# Patient Record
Sex: Male | Born: 1975 | Race: White | Hispanic: No | Marital: Married | Smoking: Never smoker
Health system: Southern US, Community
[De-identification: ages and names within clinical notes are randomized; demographics above are authoritative.]

## PROBLEM LIST (undated history)

## (undated) DIAGNOSIS — J45909 Unspecified asthma, uncomplicated: Secondary | ICD-10-CM

## (undated) DIAGNOSIS — M1990 Unspecified osteoarthritis, unspecified site: Secondary | ICD-10-CM

## (undated) DIAGNOSIS — E669 Obesity, unspecified: Secondary | ICD-10-CM

## (undated) DIAGNOSIS — G4730 Sleep apnea, unspecified: Secondary | ICD-10-CM

## (undated) DIAGNOSIS — G43909 Migraine, unspecified, not intractable, without status migrainosus: Secondary | ICD-10-CM

## (undated) DIAGNOSIS — I4891 Unspecified atrial fibrillation: Secondary | ICD-10-CM

## (undated) DIAGNOSIS — M797 Fibromyalgia: Secondary | ICD-10-CM

## (undated) HISTORY — DX: Unspecified asthma, uncomplicated: J45.909

## (undated) HISTORY — DX: Unspecified osteoarthritis, unspecified site: M19.90

## (undated) HISTORY — DX: Migraine, unspecified, not intractable, without status migrainosus: G43.909

## (undated) HISTORY — DX: Sleep apnea, unspecified: G47.30

## (undated) HISTORY — DX: Obesity, unspecified: E66.9

## (undated) HISTORY — DX: Unspecified atrial fibrillation: I48.91

## (undated) HISTORY — DX: Fibromyalgia: M79.7

---

## 2003-11-08 ENCOUNTER — Encounter (HOSPITAL_COMMUNITY): Admission: RE | Admit: 2003-11-08 | Discharge: 2003-12-08 | Payer: Self-pay | Admitting: Orthopedic Surgery

## 2003-12-13 ENCOUNTER — Encounter (HOSPITAL_COMMUNITY): Admission: RE | Admit: 2003-12-13 | Discharge: 2004-01-12 | Payer: Self-pay | Admitting: Orthopedic Surgery

## 2003-12-21 ENCOUNTER — Encounter (HOSPITAL_COMMUNITY): Admission: RE | Admit: 2003-12-21 | Discharge: 2004-01-19 | Payer: Self-pay | Admitting: Orthopedic Surgery

## 2004-05-12 ENCOUNTER — Ambulatory Visit (HOSPITAL_COMMUNITY): Admission: RE | Admit: 2004-05-12 | Discharge: 2004-05-12 | Payer: Self-pay | Admitting: Specialist

## 2004-07-22 HISTORY — PX: SHOULDER SURGERY: SHX246

## 2004-08-13 ENCOUNTER — Encounter (HOSPITAL_COMMUNITY): Admission: RE | Admit: 2004-08-13 | Discharge: 2004-09-12 | Payer: Self-pay | Admitting: Orthopedic Surgery

## 2004-09-24 ENCOUNTER — Encounter (HOSPITAL_COMMUNITY): Admission: RE | Admit: 2004-09-24 | Discharge: 2004-10-24 | Payer: Self-pay | Admitting: Specialist

## 2005-02-13 ENCOUNTER — Encounter: Admission: RE | Admit: 2005-02-13 | Discharge: 2005-02-13 | Payer: Self-pay | Admitting: Specialist

## 2006-02-28 ENCOUNTER — Emergency Department (HOSPITAL_COMMUNITY): Admission: EM | Admit: 2006-02-28 | Discharge: 2006-02-28 | Payer: Self-pay | Admitting: Family Medicine

## 2006-04-09 ENCOUNTER — Ambulatory Visit (HOSPITAL_COMMUNITY): Admission: RE | Admit: 2006-04-09 | Discharge: 2006-04-09 | Payer: Self-pay | Admitting: General Surgery

## 2006-04-21 HISTORY — PX: HEMANGIOMA EXCISION: SHX1734

## 2006-05-16 ENCOUNTER — Encounter: Admission: RE | Admit: 2006-05-16 | Discharge: 2006-05-16 | Payer: Self-pay | Admitting: Vascular Surgery

## 2006-05-28 ENCOUNTER — Ambulatory Visit (HOSPITAL_COMMUNITY): Admission: RE | Admit: 2006-05-28 | Discharge: 2006-05-28 | Payer: Self-pay | Admitting: Vascular Surgery

## 2010-10-18 ENCOUNTER — Encounter: Payer: Self-pay | Admitting: Family Medicine

## 2010-10-18 DIAGNOSIS — G43909 Migraine, unspecified, not intractable, without status migrainosus: Secondary | ICD-10-CM

## 2010-10-18 DIAGNOSIS — J45909 Unspecified asthma, uncomplicated: Secondary | ICD-10-CM | POA: Insufficient documentation

## 2013-04-21 ENCOUNTER — Encounter: Payer: Self-pay | Admitting: Physician Assistant

## 2013-04-21 ENCOUNTER — Ambulatory Visit (INDEPENDENT_AMBULATORY_CARE_PROVIDER_SITE_OTHER): Payer: BC Managed Care – PPO | Admitting: Physician Assistant

## 2013-04-21 VITALS — BP 142/96 | HR 104 | Temp 100.9°F | Resp 20 | Ht 69.5 in | Wt 233.0 lb

## 2013-04-21 DIAGNOSIS — A084 Viral intestinal infection, unspecified: Secondary | ICD-10-CM

## 2013-04-21 DIAGNOSIS — A088 Other specified intestinal infections: Secondary | ICD-10-CM

## 2013-04-21 LAB — CBC W/MCH & 3 PART DIFF
HCT: 44.8 % (ref 39.0–52.0)
Hemoglobin: 15.2 g/dL (ref 13.0–17.0)
Lymphocytes Relative: 9 % — ABNORMAL LOW (ref 12–46)
Lymphs Abs: 0.9 10*3/uL (ref 0.7–4.0)
MCH: 29.2 pg (ref 26.0–34.0)
MCHC: 33.9 g/dL (ref 30.0–36.0)
MCV: 86 fL (ref 78.0–100.0)
Neutro Abs: 8.4 10*3/uL — ABNORMAL HIGH (ref 1.7–7.7)
Neutrophils Relative %: 85 % — ABNORMAL HIGH (ref 43–77)
Platelets: 213 10*3/uL (ref 150–400)
RBC: 5.21 MIL/uL (ref 4.22–5.81)
RDW: 13.6 % (ref 11.5–15.5)
WBC mixed population %: 6 % (ref 3–18)
WBC mixed population: 0.6 10*3/uL (ref 0.1–1.8)
WBC: 9.9 10*3/uL (ref 4.0–10.5)

## 2013-04-21 LAB — COMPLETE METABOLIC PANEL WITH GFR
ALT: 20 U/L (ref 0–53)
AST: 17 U/L (ref 0–37)
Albumin: 4.3 g/dL (ref 3.5–5.2)
Alkaline Phosphatase: 76 U/L (ref 39–117)
BUN: 11 mg/dL (ref 6–23)
CO2: 24 mEq/L (ref 19–32)
Calcium: 9.3 mg/dL (ref 8.4–10.5)
Chloride: 100 mEq/L (ref 96–112)
Creat: 1.15 mg/dL (ref 0.50–1.35)
GFR, Est African American: >89 mL/min
GFR, Est Non African American: 81 mL/min
Glucose, Bld: 102 mg/dL — ABNORMAL HIGH (ref 70–99)
Potassium: 4.2 mEq/L (ref 3.5–5.3)
Sodium: 136 mEq/L (ref 135–145)
Total Bilirubin: 0.5 mg/dL (ref 0.3–1.2)
Total Protein: 6.8 g/dL (ref 6.0–8.3)

## 2013-04-21 NOTE — Progress Notes (Signed)
Patient ID: Stephen Allen MRN: [id number], DOB: 24-Mar-1976, 37 y.o. Date of Encounter: 04/21/2013, 12:56 PM    Chief Complaint:  Chief Complaint  Patient presents with  . c/o fever, diarrhea x 3 days    no vomiting, does have abd pain     HPI: 37 y.o. year old white male reports that he started feeling sick 4 days ago. At that time he just felt tired and weak. The next day he developed diarrhea and fever. Later that day he was having diarrhea every 15-30 minutes. He says that this is continued except for when he is sleeping! Has had no vomiting. No significant nausea. No localized or focal abdominal pain. Has diffuse crampy type pains throughout his abdomen. Says that his wife and kids are not sick but also notes that she works as a TEFL teacher and says she has "sterilized  the house."  He has no GI history.     Home Meds: See attached medication section for any medications that were entered at today's visit. The computer does not put those onto this list.The following list is a list of meds entered prior to today's visit.   Current Outpatient Prescriptions on File Prior to Visit  Medication Sig Dispense Refill  . budesonide-formoterol (SYMBICORT) 160-4.5 MCG/ACT inhaler Inhale 2 puffs into the lungs 2 (two) times daily.        . fish oil-omega-3 fatty acids 1000 MG capsule Take 2 g by mouth 2 (two) times daily.         No current facility-administered medications on file prior to visit.    Allergies:  Allergies  Allergen Reactions  . Ventolin [Kdc:Albuterol]     Migraines      Review of Systems: See HPI for pertinent ROS. All other ROS negative.    Physical Exam: Blood pressure 142/96, pulse 104, temperature 100.9 F (38.3 C), temperature source Oral, resp. rate 20, height 5' 9.5" (1.765 m), weight 233 lb (105.688 kg)., Body mass index is 33.93 kg/(m^2). General: Well-nourished well-developed white male. Appears in no acute distress. Lungs: Clear bilaterally to  auscultation without wheezes, rales, or rhonchi. Breathing is unlabored. Heart: Regular rhythm. No murmurs, rubs, or gallops. Abdomen: Soft, non-tender, non-distended with normoactive bowel sounds. No hepatomegaly. No rebound/guarding. No obvious abdominal masses. I have firmly palpated his abdomen and there are no areas of significant tenderness. No tenderness in the Peri Umbilical or right lower quadrant. No tenderness in the left lower quadrant. Msk:  Strength and tone normal for age. Extremities/Skin: Warm and dry. No clubbing or cyanosis. No edema. No rashes or suspicious lesions. Neuro: Alert and oriented X 3. Moves all extremities spontaneously. Gait is normal. CNII-XII grossly in tact. Psych:  Responds to questions appropriately with a normal affect.   Results for orders placed in visit on 04/21/13  CBC [facility] & 3 PART DIFF      Result Value Range   WBC 9.9  4.0 - 10.5 K/uL   RBC 5.21  4.22 - 5.81 MIL/uL   Hemoglobin 15.2  13.0 - 17.0 g/dL   HCT 40.9  81.1 - 91.4 %   MCV 86.0  78.0 - 100.0 fL   MCH 29.2  26.0 - 34.0 pg   MCHC 33.9  30.0 - 36.0 g/dL   RDW 78.2  95.6 - 21.3 %   Platelets 213  150 - 400 K/uL   Neutrophils Relative % 85 (*) 43 - 77 %   Neutro Abs 8.4 (*) 1.7 - 7.7  K/uL   Lymphocytes Relative 9 (*) 12 - 46 %   Lymphs Abs 0.9  0.7 - 4.0 K/uL   WBC mixed population % 6  3 - 18 %   WBC mixed population 0.6  0.1 - 1.8 K/uL     ASSESSMENT AND PLAN:  37 y.o. year old male with  1. Viral gastroenteritis No tenderness with palpation to suggest appendicitis or diverticulitis. As well white count normal. All consistent with viral gastroenteritis. Discussed using IV fluids but patient does not feel dehydrated. Says he does not feel lightheaded at all. Continue to force fluids. Stay with clear liquid diet until diarrhea resolves and then for 24 hours after. Then can gradually go to a very bland diet. Given duration and frequency of his diarrhea I recommend he go ahead and  use some over-the-counter Imodium to slow this down to prevent dehydration. Followup if worsens or  becomes lightheaded/develops signs and symptoms of dehydration or if diarrhea persists a full week without resolution - COMPLETE METABOLIC PANEL WITH GFR - CBC w/[facility] & 3 Part Diff   Signed, [address] [place], [place], [facility] 04/21/2013 12:56 PM

## 2013-07-20 ENCOUNTER — Telehealth: Payer: Self-pay | Admitting: Family Medicine

## 2013-07-20 NOTE — Telephone Encounter (Signed)
Can you elaborate on this a little more??

## 2013-07-20 NOTE — Telephone Encounter (Signed)
Pt is wanting to know what is going on with his preventive inhaler Pharmacy is [facility] back [phone]

## 2013-07-24 ENCOUNTER — Other Ambulatory Visit: Payer: Self-pay | Admitting: Family Medicine

## 2014-09-26 ENCOUNTER — Ambulatory Visit (INDEPENDENT_AMBULATORY_CARE_PROVIDER_SITE_OTHER): Payer: BC Managed Care – PPO | Admitting: Physician Assistant

## 2014-09-26 ENCOUNTER — Encounter: Payer: Self-pay | Admitting: Physician Assistant

## 2014-09-26 VITALS — BP 126/76 | HR 76 | Temp 97.9°F | Resp 18 | Ht 69.5 in | Wt 246.0 lb

## 2014-09-26 DIAGNOSIS — B351 Tinea unguium: Secondary | ICD-10-CM

## 2014-09-26 DIAGNOSIS — Z0000 Encounter for general adult medical examination without abnormal findings: Secondary | ICD-10-CM | POA: Diagnosis not present

## 2014-09-26 DIAGNOSIS — M25572 Pain in left ankle and joints of left foot: Secondary | ICD-10-CM | POA: Diagnosis not present

## 2014-09-26 DIAGNOSIS — J4520 Mild intermittent asthma, uncomplicated: Secondary | ICD-10-CM | POA: Diagnosis not present

## 2014-09-26 MED ORDER — TERBINAFINE HCL 250 MG PO TABS: 250.0000 mg | ORAL_TABLET | Freq: Every day | ORAL | Status: DC

## 2014-09-26 MED ORDER — HYDROCODONE-ACETAMINOPHEN 5-325 MG PO TABS: 1.0000 | ORAL_TABLET | Freq: Four times a day (QID) | ORAL | Status: DC | PRN

## 2014-09-26 MED ORDER — CYCLOBENZAPRINE HCL 10 MG PO TABS: 10.0000 mg | ORAL_TABLET | Freq: Three times a day (TID) | ORAL | Status: DC | PRN

## 2014-09-26 NOTE — Progress Notes (Signed)
Patient ID: Stephen BussingDaniel K Allen MRN: [id number], DOB: 09-06-1975 39 y.o. Date of Encounter: 09/26/2014, 4:17 PM    Chief Complaint: Physical (CPE)  HPI: 39 y.o. y/o white male here for CPE.   He has a couple of issues he wanted to discuss today.  Says that he has been having pain in his feet left worse than right. Says that has been bothering him for quite some time.  Says he thinks he may have toenail fungus and is wondering about treatment for this.  States that he has had no problem with his asthma in several years. Is not using Symbicort.   Says  he is not currently needing too take any medications at present.  Says he take likes to keep Flexeril and Vicodin on hand in case he has a flare of back pain. Is requesting refills on these today because he thinks his last prescription is expired.   Review of Systems: Consitutional: No fever, chills, fatigue, night sweats, lymphadenopathy, or weight changes. Eyes: No visual changes, eye redness, or discharge. ENT/Mouth: Ears: No otalgia, tinnitus, hearing loss, discharge. Nose: No congestion, rhinorrhea, sinus pain, or epistaxis. Throat: No sore throat, post nasal drip, or teeth pain. Cardiovascular: No CP, palpitations, diaphoresis, DOE, edema, orthopnea, PND. Respiratory: No cough, hemoptysis, SOB, or wheezing. Gastrointestinal: No anorexia, dysphagia, reflux, pain, nausea, vomiting, hematemesis, diarrhea, constipation, BRBPR, or melena. Genitourinary: No dysuria, frequency, urgency, hematuria, incontinence, nocturia, decreased urinary stream, discharge, impotence, or testicular pain/masses. Musculoskeletal: No decreased ROM, myalgias, stiffness, joint swelling, or weakness. Skin: No rash, erythema, lesion changes, pain, warmth, jaundice, or pruritis. Neurological: No headache, dizziness, syncope, seizures, tremors, memory loss, coordination problems, or paresthesias. Psychological: No anxiety, depression, hallucinations,  SI/HI. Endocrine: No fatigue, polydipsia, polyphagia, polyuria, or known diabetes. All other systems were reviewed and are otherwise negative.  Past Medical History  Diagnosis Date  . Asthma     Dx'd age 89  . Migraines      History reviewed. No pertinent past surgical history.  Home Meds:  Outpatient Prescriptions Prior to Visit  Medication Sig Dispense Refill  . ibuprofen (ADVIL,MOTRIN) 200 MG tablet Take 200 mg by mouth every 6 (six) hours as needed for pain.    . budesonide-formoterol (SYMBICORT) 160-4.5 MCG/ACT inhaler Inhale 2 puffs into the lungs 2 (two) times daily.      . fish oil-omega-3 fatty acids 1000 MG capsule Take 2 g by mouth 2 (two) times daily.      Marland Kitchen. QVAR 80 MCG/ACT inhaler INHALE 2 PUFFS TWICE DAILY. (Patient not taking: Reported on 09/26/2014) 8.7 g PRN   No facility-administered medications prior to visit.    Allergies:  Allergies  Allergen Reactions  . Ventolin [Kdc:Albuterol]     Migraines    History   Social History  . Marital Status: Married    Spouse Name: N/A  . Number of Children: N/A  . Years of Education: N/A   Occupational History  . Not on file.   Social History Main Topics  . Smoking status: Never Smoker   . Smokeless tobacco: Never Used  . Alcohol Use: 0.0 oz/week    0 Standard drinks or equivalent per week  . Drug Use: No  . Sexual Activity: Not on file   Other Topics Concern  . Not on file   Social History Narrative    Family History  Problem Relation Age of Onset  . Hypertension Father   . Asthma Brother   . Cancer Paternal Grandmother  Breast    Physical Exam: Blood pressure 126/76, pulse 76, temperature 97.9 F (36.6 C), temperature source Oral, resp. rate 18, height 5' 9.5" (1.765 m), weight 246 lb (111.585 kg).  General: Well developed, well nourished,WM. Appears in no acute distress. HEENT: Normocephalic, atraumatic. Conjunctiva pink, sclera non-icteric. Pupils 2 mm constricting to 1 mm, round, regular,  and equally reactive to light and accomodation. EOMI. Internal auditory canal clear. TMs with good cone of light and without pathology. Nasal mucosa pink. Nares are without discharge. No sinus tenderness. Oral mucosa pink. Dentition good. Pharynx without exudate.   Neck: Supple. Trachea midline. No thyromegaly. Full ROM. No lymphadenopathy. No carotid bruit. Lungs: Clear to auscultation bilaterally without wheezes, rales, or rhonchi. Breathing is of normal effort and unlabored. Cardiovascular: RRR with S1 S2. No murmurs, rubs, or gallops. Distal pulses 2+ symmetrically. No carotid or abdominal bruits. Abdomen: Soft, non-tender, non-distended with normoactive bowel sounds. No hepatosplenomegaly or masses. No rebound/guarding. No CVA tenderness. No hernias. Musculoskeletal: Full range of motion and 5/5 strength throughout.  Left Foot: Tender with palpation at edge of foot, near outer side of foot. No tenderness with palpation along medial edge of foot and along arch. No tenderness with palpation of the heel. Skin: Warm and moist without erythema, ecchymosis, wounds, or rash. Toenails: Right second and fifth toenails are thick and opaque consistent with onychomycosis. Neuro: A+Ox3. CN II-XII grossly intact. Moves all extremities spontaneously. Full sensation throughout. Normal gait. DTR 2+ throughout upper and lower extremities.  Psych:  Responds to questions appropriately with a normal affect.   Assessment/Plan:  39 y.o. y/o  male here for CPE  -1. Visit for preventive health examination  A. Screening Labs: He had full panel of screening labs 06/04/12 and all labs were normal. You did CBC, CMP T, FLP, TSH. I discussed having him return fasting to check lipid panel and discuss whether he thinks his cholesterol would be fairly stable compared to 2013. He thinks his diet would probably actually improved compared to then. We can wait and hold off on repeating all of these labs. He is not fasting  today.  B. Screening For Prostate Cancer: No indication to start this screening until age 2  C. Screening For Colorectal Cancer: No indication to start this screening until age 68  D. Immunizations: Flu----N/A Tetanus--- received here 02/2007 Pneumococcal---no indication to need this until age 91 Zostavax-------not  indicated until age 74     2. Onychomycosis Discussed potential risk of medication with patient. Discussed with him that we need to check LFTs now and if they are normal then he can proceed with taking the Lamisil. Then, he will need to recheck LFTs in about 4 weeks. Patient voices understanding and agrees and accepts potential risk. - Hepatic function panel - terbinafine (LAMISIL) 250 MG tablet; Take 1 tablet (250 mg total) by mouth daily.  Dispense: 30 tablet; Refill: 2  3. Pain in joint, ankle and foot, left His symptoms are not consistent with common plantar fasciitis or Heel spur. He says that this is been occurring for a long time. Will refer to podiatry. - Ambulatory referral to Podiatry  4. Asthma, mild intermittent, uncomplicated States that he has been off of Symbicort for a long time. States that he has no flares of asthma.  5. Back Pain:  Says that his prior prescriptions for hydrocodone and Flexeril are expired and would like to have some on hand to use if needed. Today a printed a prescription for hydrocodone 5/325 #  30+0. I also sent in refill on Flexeril.  Signed:   [address] [place], [place]  09/26/2014 4:17 PM

## 2014-09-27 ENCOUNTER — Encounter: Payer: Self-pay | Admitting: Family Medicine

## 2014-09-27 ENCOUNTER — Other Ambulatory Visit: Payer: Self-pay | Admitting: Family Medicine

## 2014-09-27 DIAGNOSIS — Z79899 Other long term (current) drug therapy: Secondary | ICD-10-CM

## 2014-09-27 DIAGNOSIS — B351 Tinea unguium: Secondary | ICD-10-CM

## 2014-09-27 LAB — HEPATIC FUNCTION PANEL
ALT: 20 U/L (ref 0–53)
AST: 15 U/L (ref 0–37)
Albumin: 4.5 g/dL (ref 3.5–5.2)
Alkaline Phosphatase: 96 U/L (ref 39–117)
Bilirubin, Direct: 0.1 mg/dL (ref 0.0–0.3)
Indirect Bilirubin: 0.2 mg/dL (ref 0.2–1.2)
Total Bilirubin: 0.3 mg/dL (ref 0.2–1.2)
Total Protein: 7.2 g/dL (ref 6.0–8.3)

## 2014-11-01 ENCOUNTER — Other Ambulatory Visit: Payer: BC Managed Care – PPO

## 2014-11-01 DIAGNOSIS — Z79899 Other long term (current) drug therapy: Secondary | ICD-10-CM

## 2014-11-01 DIAGNOSIS — B351 Tinea unguium: Secondary | ICD-10-CM

## 2014-11-01 LAB — HEPATIC FUNCTION PANEL
ALT: 22 U/L (ref 0–53)
AST: 15 U/L (ref 0–37)
Albumin: 4.2 g/dL (ref 3.5–5.2)
Alkaline Phosphatase: 88 U/L (ref 39–117)
Bilirubin, Direct: 0.1 mg/dL (ref 0.0–0.3)
Indirect Bilirubin: 0.4 mg/dL (ref 0.2–1.2)
Total Bilirubin: 0.5 mg/dL (ref 0.2–1.2)
Total Protein: 6.7 g/dL (ref 6.0–8.3)

## 2016-09-17 ENCOUNTER — Telehealth: Payer: Self-pay | Admitting: Family Medicine

## 2016-09-17 NOTE — Telephone Encounter (Signed)
Patient call after hours on February 24 he been having cough congestion increased sputum production. He has underlying asthma. This been going on for about 2 weeks. He's tried over-the-counter medications. I prescribed him azithromycin as well as albuterol inhaler which he was out of.advised to  come in to the office for recheck if he was not improving.

## 2016-09-27 ENCOUNTER — Encounter: Payer: Self-pay | Admitting: Family Medicine

## 2016-09-27 ENCOUNTER — Ambulatory Visit (INDEPENDENT_AMBULATORY_CARE_PROVIDER_SITE_OTHER): Payer: BC Managed Care – PPO | Admitting: Family Medicine

## 2016-09-27 VITALS — BP 136/68 | HR 82 | Temp 98.1°F | Resp 14 | Ht 69.5 in | Wt 249.0 lb

## 2016-09-27 DIAGNOSIS — J209 Acute bronchitis, unspecified: Secondary | ICD-10-CM

## 2016-09-27 MED ORDER — AMOXICILLIN-POT CLAVULANATE 875-125 MG PO TABS: 1.0000 | ORAL_TABLET | Freq: Two times a day (BID) | ORAL | 0 refills | Status: DC

## 2016-09-27 MED ORDER — PREDNISONE 10 MG PO TABS: ORAL_TABLET | ORAL | 0 refills | Status: DC

## 2016-09-27 MED ORDER — HYDROCOD POLST-CPM POLST ER 10-8 MG/5ML PO SUER: 5.0000 mL | Freq: Two times a day (BID) | ORAL | 0 refills | Status: DC | PRN

## 2016-09-27 NOTE — Progress Notes (Signed)
   Subjective:    Patient ID: Stephen Allen, male    DOB: 1975/10/27, 41 y.o.   MRN: [id number]  Patient presents for Cough (x3 weeks- has taken Z-pack- productive cough with green mucus)  Pt here with cough with production for past 4 weeks, he called about 10 days ago, given zpak and albuterol. Has history of asthma. Has had some wheezing as well. OTC cough meds not helping. No recent fever. Albuterol helps but gives him a headache. Has not needed albuterol in years Feels he continues to worsen . Non smoker     Review Of Systems:  GEN- denies fatigue, fever, weight loss,weakness, recent illness HEENT- denies eye drainage, change in vision, nasal discharge, CVS- denies chest pain, palpitations RESP- denies SOB, +cough, +wheeze ABD- denies N/V, change in stools, abd pain GU- denies dysuria, hematuria, dribbling, incontinence MSK- denies joint pain, muscle aches, injury Neuro- denies headache, dizziness, syncope, seizure activity       Objective:    BP 136/68   Pulse 82   Temp 98.1 F (36.7 C) (Oral)   Resp 14   Ht 5' 9.5" (1.765 m)   Wt 249 lb (112.9 kg)   SpO2 99%   BMI 36.24 kg/m  GEN- NAD, alert and oriented x3 HEENT- PERRL, EOMI, non injected sclera, pink conjunctiva, MMM, oropharynx clear Neck- Supple, no LAD  CVS- RRR, no murmur RESP-scattered wheeze, good air movement, rhonchi bilat  EXT- No edema Pulses- Radial  2+        Assessment & Plan:      Problem List Items Addressed This Visit    None    Visit Diagnoses    Acute bronchitis, unspecified organism    -  Primary   prolonged illness, prednisone, tussionex, augmentin. oxygen sat normal.CXR if no improvement. discussed albuterol could try xopenex but he rarely uses so he declined      Note: This dictation was prepared with Dragon dictation along with smaller phrase technology. Any transcriptional errors that result from this process are unintentional.

## 2016-09-27 NOTE — Patient Instructions (Signed)
Take antibiotics as prescribed Take cough medicine as prescribed F/U as needed

## 2016-10-10 ENCOUNTER — Encounter: Payer: Self-pay | Admitting: Family Medicine

## 2016-10-10 ENCOUNTER — Ambulatory Visit (INDEPENDENT_AMBULATORY_CARE_PROVIDER_SITE_OTHER): Payer: BC Managed Care – PPO | Admitting: Family Medicine

## 2016-10-10 VITALS — BP 140/80 | HR 80 | Temp 97.9°F | Resp 16 | Ht 70.0 in | Wt 249.0 lb

## 2016-10-10 DIAGNOSIS — J209 Acute bronchitis, unspecified: Secondary | ICD-10-CM | POA: Diagnosis not present

## 2016-10-10 NOTE — Progress Notes (Signed)
   Subjective:    Patient ID: Stephen Allen, male    DOB: Aug 15, 1975, 41 y.o.   MRN: [id number]  HPI The cough began approximately 6-8 weeks ago. Was initially called out a Z-Pak in February. This helped slightly. Soma partner on March 9 and was given prednisone and Augmentin. This helped substantially. He is concerned however because the cough persist. Originally the cough is productive of green and brown sputum. There was a lot of wheezing and fevers. After the Augmentin and the prednisone this is subsided. It is now more of an irritant cough. It waxes and wanes. Some days are excellent. Some days are more problematic. He denies any pleurisy or hemoptysis or weight loss or fevers or night sweats. He denies any shortness of breath. Denies any recent travel, exposure to TB or pertussis.   Past Medical History:  Diagnosis Date  . Asthma    Dx'd age 87  . Migraines    Past Surgical History:  Procedure Laterality Date  . HEMANGIOMA EXCISION  04/2006  . SHOULDER SURGERY Left 2006   No current outpatient prescriptions on file prior to visit.   No current facility-administered medications on file prior to visit.    Allergies  Allergen Reactions  . Albuterol     Migraine   . Ventolin [Kdc:Albuterol]     Migraines   Social History   Social History  . Marital status: Married    Spouse name: N/A  . Number of children: N/A  . Years of education: N/A   Occupational History  . Not on file.   Social History Main Topics  . Smoking status: Never Smoker  . Smokeless tobacco: Never Used  . Alcohol use 0.0 oz/week  . Drug use: No  . Sexual activity: Not on file   Other Topics Concern  . Not on file   Social History Narrative  . No narrative on file      Review of Systems  All other systems reviewed and are negative.      Objective:   Physical Exam  Constitutional: He appears well-developed and well-nourished. No distress.  HENT:  Right Ear: External ear normal.  Left  Ear: External ear normal.  Nose: Nose normal.  Mouth/Throat: Oropharynx is clear and moist. No oropharyngeal exudate.  Eyes: Conjunctivae are normal.  Neck: No thyromegaly present.  Cardiovascular: Normal rate, regular rhythm and normal heart sounds.   No murmur heard. Pulmonary/Chest: Effort normal and breath sounds normal. No respiratory distress. He has no wheezes. He has no rales.  Lymphadenopathy:    He has no cervical adenopathy.  Skin: He is not diaphoretic.  Vitals reviewed.         Assessment & Plan:  Acute bronchitis, unspecified organism  Symptoms are now been ongoing for 6-8 weeks. I believe he has a case of chronic bronchitis. I doubt whooping cough or TB. He declines chest x-ray. He states that he feels better. I believe he is dealing with residual inflammation and possibly some upper airway cough syndrome. I recommended starting Symbicort 160/4.5, 2 puffs inhaled twice a day. He can take Tessalon as needed for cough to break the cycle of coughing. If cough is not improving after 1 additional week to demand that he gets a chest x-ray. He politely declines at the present time. He denies any allergies. He denies any acid reflux

## 2017-01-15 ENCOUNTER — Ambulatory Visit (INDEPENDENT_AMBULATORY_CARE_PROVIDER_SITE_OTHER): Payer: BC Managed Care – PPO | Admitting: Family Medicine

## 2017-01-15 ENCOUNTER — Encounter: Payer: Self-pay | Admitting: Family Medicine

## 2017-01-15 ENCOUNTER — Ambulatory Visit (HOSPITAL_COMMUNITY)
Admission: RE | Admit: 2017-01-15 | Discharge: 2017-01-15 | Disposition: A | Discharge status: Home / Self Care | Payer: BC Managed Care – PPO | Source: Ambulatory Visit | Attending: Family Medicine | Admitting: Family Medicine

## 2017-01-15 VITALS — BP 138/82 | HR 72 | Temp 98.3°F | Resp 16 | Ht 70.0 in | Wt 254.0 lb

## 2017-01-15 DIAGNOSIS — R0602 Shortness of breath: Secondary | ICD-10-CM

## 2017-01-15 DIAGNOSIS — R079 Chest pain, unspecified: Secondary | ICD-10-CM | POA: Diagnosis present

## 2017-01-15 DIAGNOSIS — E669 Obesity, unspecified: Secondary | ICD-10-CM

## 2017-01-15 LAB — CBC WITH DIFFERENTIAL/PLATELET
Basophils Absolute: 0 cells/uL (ref 0–200)
Basophils Relative: 0 %
Eosinophils Absolute: 116 cells/uL (ref 15–500)
Eosinophils Relative: 2 %
HCT: 44.2 % (ref 38.5–50.0)
Hemoglobin: 15 g/dL (ref 13.0–17.0)
Lymphocytes Relative: 33 %
Lymphs Abs: 1914 cells/uL (ref 850–3900)
MCH: 29.1 pg (ref 27.0–33.0)
MCHC: 33.9 g/dL (ref 32.0–36.0)
MCV: 85.8 fL (ref 80.0–100.0)
MPV: 9.3 fL (ref 7.5–12.5)
Monocytes Absolute: 406 cells/uL (ref 200–950)
Monocytes Relative: 7 %
Neutro Abs: 3364 cells/uL (ref 1500–7800)
Neutrophils Relative %: 58 %
Platelets: 263 10*3/uL (ref 140–400)
RBC: 5.15 MIL/uL (ref 4.20–5.80)
RDW: 13.9 % (ref 11.0–15.0)
WBC: 5.8 10*3/uL (ref 3.8–10.8)

## 2017-01-15 LAB — TSH: TSH: 2.84 mIU/L (ref 0.40–4.50)

## 2017-01-15 NOTE — Progress Notes (Signed)
   Subjective:    Patient ID: Stephen Allen, male    DOB: 1975-12-20, 41 y.o.   MRN: [id number]  Patient presents for SOB (x1 year- intermittent SOB with chest tightness and fatigue- states that he also has anxiety)   Intermittant episodes of shortness of breath, vision was blurryu yesterday, fatigue   Chest felt tight all day yesterday. Walked through [employer] was winded, just with regular walking speed  Has had some incresaed anxiety during episodes, but feels stress is at all time low , since he isnot working his regular job during the summer. Denies any depression symptoms  Every episode he has chest tightness, non radiating, no N/V no diaphoresis, states this feels different from his asthma     Review Of Systems:  GEN- denies fatigue, fever, weight loss,weakness, recent illness HEENT- denies eye drainage, change in vision, nasal discharge, CVS-+ chest pain, palpitations RESP- + SOB, denies cough, wheeze ABD- denies N/V, change in stools, abd pain GU- denies dysuria, hematuria, dribbling, incontinence MSK- denies joint pain, muscle aches, injury Neuro- denies headache, dizziness, syncope, seizure activity       Objective:    BP 138/82   Pulse 72   Temp 98.3 F (36.8 C) (Oral)   Resp 16   Ht 5\' 10"  (1.778 m)   Wt 254 lb (115.2 kg)   SpO2 99%   BMI 36.45 kg/m  GEN- NAD, alert and oriented x3 HEENT- PERRL, EOMI, non injected sclera, pink conjunctiva, MMM, oropharynx clear Neck- Supple, no thyromegaly CVS- RRR, no murmur RESP-CTAB ABD-NABS,soft,NT,ND EXT- No edema Psych- normal affect and mood Pulses- Radial 2+  EKG- NSR, no ST changes  Peak Flow 500 x 3       Assessment & Plan:      Problem List Items Addressed This Visit    Obesity (BMI 30-39.9)   Relevant Orders   Lipid panel (Completed)   TSH (Completed)    Other Visit Diagnoses    Chest pain, unspecified type    -  Primary   ? underlying cardiac issue, though states he can exercise without  difficulty. Does not sound like exacerbation, denies any recent increase in stress, no GI symptoms Obtain CXR and labs, if these are negative Send to cardiology for evaluation No family history of CAD He is non smoker    Relevant Orders   CBC with Differential/Platelet (Completed)   Comprehensive metabolic panel (Completed)   TSH (Completed)   DG Chest 2 View (Completed)   EKG 12-Lead (Completed)   Shortness of breath       Relevant Orders   DG Chest 2 View (Completed)   EKG 12-Lead (Completed)      Note: This dictation was prepared with Dragon dictation along with smaller phrase technology. Any transcriptional errors that result from this process are unintentional.

## 2017-01-15 NOTE — Progress Notes (Signed)
Peak Flow: 500 500 550

## 2017-01-16 LAB — LIPID PANEL
Cholesterol: 187 mg/dL (ref <200)
HDL: 35 mg/dL — ABNORMAL LOW (ref >40)
LDL Cholesterol: 86 mg/dL (ref <100)
Total CHOL/HDL Ratio: 5.3 Ratio — ABNORMAL HIGH (ref <5.0)
Triglycerides: 328 mg/dL — ABNORMAL HIGH (ref <150)
VLDL: 66 mg/dL — ABNORMAL HIGH (ref <30)

## 2017-01-16 LAB — COMPREHENSIVE METABOLIC PANEL
ALT: 18 U/L (ref 9–46)
AST: 14 U/L (ref 10–40)
Albumin: 4.3 g/dL (ref 3.6–5.1)
Alkaline Phosphatase: 85 U/L (ref 40–115)
BUN: 11 mg/dL (ref 7–25)
CO2: 22 mmol/L (ref 20–31)
Calcium: 9 mg/dL (ref 8.6–10.3)
Chloride: 103 mmol/L (ref 98–110)
Creat: 0.93 mg/dL (ref 0.60–1.35)
Glucose, Bld: 82 mg/dL (ref 70–99)
Potassium: 4 mmol/L (ref 3.5–5.3)
Sodium: 138 mmol/L (ref 135–146)
Total Bilirubin: 0.5 mg/dL (ref 0.2–1.2)
Total Protein: 6.9 g/dL (ref 6.1–8.1)

## 2017-02-24 NOTE — Progress Notes (Signed)
Cardiology Office Note   Date:  02/25/2017   ID:  Stephen Allen, DOB 06/03/1976, MRN [id number]  PCP:  Salley Scarlet, MD  Cardiologist:   Charlton Haws, MD   No chief complaint on file.     History of Present Illness: Stephen Allen is a 41 y.o. male who presents for evaluation of chest pain and dyspnea. Referred by Dr Jeanice Lim. Reviewed his office not from 01/15/17 Dyspnea for a year. Some tightness in chest and fatigue Anxiety. Chest can feel tight all day. Regular walking speed shopping at [place] gets him winded. Has asthma but his seems different and not actively wheezing with tightness No family history of CAD Non smoker CXR reveiwed 01/15/17 NAD. Labs same day reviewed and normal Hct 44.2  Cr .93 TSH 2.8 LDL 86   His pain is described as tightness that is not always exertional had it 3 times last week and associated with malaise He is overweight and has fatty diet. He is sedentary Use to ride dirt bikes. 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 Asthma    Dx'd age 72  . Migraines     Past Surgical History:  Procedure Laterality Date  . HEMANGIOMA EXCISION  04/2006  . SHOULDER SURGERY Left 2006     Current Outpatient Prescriptions  Medication Sig Dispense Refill  . ibuprofen (ADVIL,MOTRIN) 200 MG tablet Take 200 mg by mouth every 6 (six) hours as needed.     No current facility-administered medications for this visit.     Allergies:   Albuterol and Ventolin [kdc:albuterol]    Social History:  The patient  reports that he has never smoked. He has never used smokeless tobacco. He reports that he drinks alcohol. He reports that he does not use drugs.   Family History:  The patient's family history includes Asthma in his brother; Cancer in his paternal grandmother; Hypertension in his father.    ROS:  Please see the history  of present illness.   Otherwise, review of systems are positive for none.   All other systems are reviewed and negative.    PHYSICAL EXAM: VS:  BP 130/78 (BP Location: Right Arm, Cuff Size: Large)   Pulse 73   Ht 5\' 10"  (1.778 m)   Wt 252 lb 9.6 oz (114.6 kg)   SpO2 97%   BMI 36.24 kg/m  , BMI Body mass index is 36.24 kg/m. Affect appropriate Overweight white male  HEENT: normal Neck supple with no adenopathy JVP normal no bruits no thyromegaly Lungs clear with no wheezing and good diaphragmatic motion Heart:  S1/S2 no murmur, no rub, gallop or click PMI normal Abdomen: benighn, BS positve, no tenderness, no AAA no bruit.  No HSM or HJR Distal pulses intact with no bruits No edema Neuro non-focal Skin warm and dry No muscular weakness    EKG:  01/15/17 SR rate 69 RSR' otherwise normal    Recent Labs: 01/15/2017: ALT 18; BUN 11; Creat 0.93; Hemoglobin 15.0; Platelets 263; Potassium 4.0; Sodium 138; TSH 2.84    Lipid Panel    Component Value Date/Time   CHOL 187 01/15/2017 1024   TRIG 328 (H) 01/15/2017 1024   HDL 35 (L) 01/15/2017 1024   CHOLHDL 5.3 (H) 01/15/2017 1024   VLDL 66 (H) 01/15/2017 1024   LDLCALC 86 01/15/2017 1024  Wt Readings from Last 3 Encounters:  02/25/17 252 lb 9.6 oz (114.6 kg)  01/15/17 254 lb (115.2 kg)  10/10/16 249 lb (112.9 kg)      Other studies Reviewed: Additional studies/ records that were reviewed today include: Notes Dr Jeanice Limurham CXR, labs and ECG see HPI.    ASSESSMENT AND PLAN:  1.  Chest Pain: atypical normal ECG f.u ETT 2. Dyspnea likely functional normal exam f/u Echo  3. Anxiety seems ok on interview today f/u primary  4. Asthma no active wheezing PRN inhaler    Current medicines are reviewed at length with the patient today.  The patient does not have concerns regarding medicines.  The following changes have been made:  no change  Labs/ tests ordered today include: Echo and ETT  No orders of the defined  types were placed in this encounter.    Disposition:   FU with cardiology PRN      Signed, Charlton HawsPeter Bowyn Mercier, MD  02/25/2017 1:14 PM    [facility] [address], [place], [place]  [address] Phone: [phone]; Fax: [phone]

## 2017-02-25 ENCOUNTER — Encounter: Payer: Self-pay | Admitting: *Deleted

## 2017-02-25 ENCOUNTER — Ambulatory Visit (INDEPENDENT_AMBULATORY_CARE_PROVIDER_SITE_OTHER): Payer: BC Managed Care – PPO | Admitting: Cardiovascular Disease

## 2017-02-25 ENCOUNTER — Encounter: Payer: Self-pay | Admitting: Cardiovascular Disease

## 2017-02-25 VITALS — BP 130/78 | HR 73 | Ht 70.0 in | Wt 252.6 lb

## 2017-02-25 DIAGNOSIS — R0600 Dyspnea, unspecified: Secondary | ICD-10-CM | POA: Diagnosis not present

## 2017-02-25 DIAGNOSIS — R079 Chest pain, unspecified: Secondary | ICD-10-CM | POA: Diagnosis not present

## 2017-02-25 NOTE — Patient Instructions (Signed)
Medication Instructions:   Your physician recommends that you continue on your current medications as directed. Please refer to the Current Medication list given to you today.  Labwork:  NONE  Testing/Procedures: Your physician has requested that you have an exercise tolerance test. For further information please visit [email or web address]. Please also follow instruction sheet, as given. Your physician has requested that you have an echocardiogram. Echocardiography is a painless test that uses sound waves to create images of your heart. It provides your doctor with information about the size and shape of your heart and how well your heart's chambers and valves are working. This procedure takes approximately one hour. There are no restrictions for this procedure.  Follow-Up:  Your physician recommends that you schedule a follow-up appointment in: as needed.  Any Other Special Instructions Will Be Listed Below (If Applicable).  If you need a refill on your cardiac medications before your next appointment, please call your pharmacy.

## 2017-03-06 ENCOUNTER — Telehealth: Payer: Self-pay

## 2017-03-06 ENCOUNTER — Ambulatory Visit (HOSPITAL_COMMUNITY)
Admission: RE | Admit: 2017-03-06 | Discharge: 2017-03-06 | Disposition: A | Discharge status: Home / Self Care | Payer: BC Managed Care – PPO | Source: Ambulatory Visit | Attending: Cardiovascular Disease | Admitting: Cardiovascular Disease

## 2017-03-06 DIAGNOSIS — R0600 Dyspnea, unspecified: Secondary | ICD-10-CM | POA: Diagnosis not present

## 2017-03-06 DIAGNOSIS — R079 Chest pain, unspecified: Secondary | ICD-10-CM | POA: Insufficient documentation

## 2017-03-06 LAB — EXERCISE TOLERANCE TEST
Estimated workload: 10.4 METS
Exercise duration (min): 9 min
Exercise duration (sec): 0 s
MPHR: 179 {beats}/min
Peak HR: 171 {beats}/min
Percent HR: 95 %
RPE: 15
Rest HR: 71 {beats}/min

## 2017-03-06 NOTE — Telephone Encounter (Signed)
Called pt. Left message on pt's private voicemail.

## 2017-03-06 NOTE — Progress Notes (Signed)
*  PRELIMINARY RESULTS* Echocardiogram 2D Echocardiogram has been performed.  Stephen Allen, Stephen Allen 03/06/2017, 10:14 AM

## 2017-03-28 ENCOUNTER — Encounter (HOSPITAL_COMMUNITY): Payer: Self-pay | Admitting: Emergency Medicine

## 2017-03-28 ENCOUNTER — Emergency Department (HOSPITAL_COMMUNITY)
Admission: EM | Admit: 2017-03-28 | Discharge: 2017-03-29 | Disposition: A | Discharge status: Home / Self Care | Payer: BC Managed Care – PPO | Attending: Emergency Medicine | Admitting: Emergency Medicine

## 2017-03-28 ENCOUNTER — Emergency Department (HOSPITAL_COMMUNITY): Payer: BC Managed Care – PPO

## 2017-03-28 DIAGNOSIS — R51 Headache: Secondary | ICD-10-CM | POA: Diagnosis present

## 2017-03-28 DIAGNOSIS — J45909 Unspecified asthma, uncomplicated: Secondary | ICD-10-CM | POA: Diagnosis not present

## 2017-03-28 DIAGNOSIS — R112 Nausea with vomiting, unspecified: Secondary | ICD-10-CM | POA: Diagnosis not present

## 2017-03-28 DIAGNOSIS — R072 Precordial pain: Secondary | ICD-10-CM | POA: Diagnosis not present

## 2017-03-28 DIAGNOSIS — G44209 Tension-type headache, unspecified, not intractable: Secondary | ICD-10-CM

## 2017-03-28 DIAGNOSIS — R0600 Dyspnea, unspecified: Secondary | ICD-10-CM | POA: Diagnosis not present

## 2017-03-28 LAB — LIPASE, BLOOD: LIPASE: 27 U/L (ref 11–51)

## 2017-03-28 LAB — CBC WITH DIFFERENTIAL/PLATELET
BASOS PCT: 0 %
Basophils Absolute: 0 10*3/uL (ref 0.0–0.1)
EOS ABS: 0.1 10*3/uL (ref 0.0–0.7)
Eosinophils Relative: 1 %
HCT: 40.3 % (ref 39.0–52.0)
Hemoglobin: 14.2 g/dL (ref 13.0–17.0)
Lymphocytes Relative: 11 %
Lymphs Abs: 1.5 10*3/uL (ref 0.7–4.0)
MCH: 29.1 pg (ref 26.0–34.0)
MCHC: 35.2 g/dL (ref 30.0–36.0)
MCV: 82.6 fL (ref 78.0–100.0)
MONO ABS: 0.7 10*3/uL (ref 0.1–1.0)
MONOS PCT: 5 %
Neutro Abs: 11.1 10*3/uL — ABNORMAL HIGH (ref 1.7–7.7)
Neutrophils Relative %: 83 %
Platelets: 283 10*3/uL (ref 150–400)
RBC: 4.88 MIL/uL (ref 4.22–5.81)
RDW: 13 % (ref 11.5–15.5)
WBC: 13.4 10*3/uL — ABNORMAL HIGH (ref 4.0–10.5)

## 2017-03-28 LAB — D-DIMER, QUANTITATIVE: D-Dimer, Quant: 0.37 ug/mL-FEU (ref 0.00–0.50)

## 2017-03-28 LAB — COMPREHENSIVE METABOLIC PANEL
ALT: 23 U/L (ref 17–63)
AST: 22 U/L (ref 15–41)
Albumin: 4.1 g/dL (ref 3.5–5.0)
Alkaline Phosphatase: 80 U/L (ref 38–126)
Anion gap: 12 (ref 5–15)
BILIRUBIN TOTAL: 0.8 mg/dL (ref 0.3–1.2)
BUN: 9 mg/dL (ref 6–20)
CO2: 20 mmol/L — ABNORMAL LOW (ref 22–32)
CREATININE: 1.06 mg/dL (ref 0.61–1.24)
Calcium: 9.1 mg/dL (ref 8.9–10.3)
Chloride: 104 mmol/L (ref 101–111)
GFR calc Af Amer: >60 mL/min (ref >60)
Glucose, Bld: 115 mg/dL — ABNORMAL HIGH (ref 65–99)
POTASSIUM: 3.5 mmol/L (ref 3.5–5.1)
Sodium: 136 mmol/L (ref 135–145)
TOTAL PROTEIN: 6.7 g/dL (ref 6.5–8.1)

## 2017-03-28 LAB — I-STAT TROPONIN, ED
TROPONIN I, POC: 0 ng/mL (ref 0.00–0.08)
Troponin i, poc: 0 ng/mL (ref 0.00–0.08)

## 2017-03-28 MED ORDER — ONDANSETRON HCL 4 MG PO TABS: 4.0000 mg | ORAL_TABLET | Freq: Four times a day (QID) | ORAL | 0 refills | Status: DC

## 2017-03-28 MED ORDER — ONDANSETRON HCL 4 MG/2ML IJ SOLN
4.0000 mg | Freq: Once | INTRAMUSCULAR | Status: DC
  Filled 2017-03-28: qty 2

## 2017-03-28 MED ORDER — NITROGLYCERIN 0.4 MG SL SUBL: 0.4000 mg | SUBLINGUAL_TABLET | SUBLINGUAL | Status: DC | PRN

## 2017-03-28 MED ORDER — SODIUM CHLORIDE 0.9 % IV BOLUS (SEPSIS)
500.0000 mL | Freq: Once | INTRAVENOUS | Status: AC
  Administered 2017-03-28: 500 mL via INTRAVENOUS

## 2017-03-28 NOTE — ED Provider Notes (Signed)
Emergency Department Provider Note   I have reviewed the triage vital signs and the nursing notes.   HISTORY  Chief Complaint Chest Pain and Headache   HPI Stephen Allen is a 41 y.o. male with PMH of migraine HA, asthma, and obesity presents to the emergency department for evaluation of headache, chest pain, shortness of breath, and near syncope. Patient states he was leaving the office because he felt like he was getting a migraine. His migraines typically are slow onset and by her temporal radiation across the forehead. As he was driving home he developed some central chest pressure/tightness. He started to feel lightheaded and nauseated so pulled over to an EMS station. While there he was given aspirin and Zofran after vomiting. He was transported to the ED for evaluation. No pleuritic chest pain or palpitations. No radiation of symptoms. No modifying factors.   Past Medical History:  Diagnosis Date  . Asthma    Dx'd age 55  . Migraines     Patient Active Problem List   Diagnosis Date Noted  . Obesity (BMI 30-39.9) 01/15/2017  . Asthma 10/18/2010  . Migraines 10/18/2010    Past Surgical History:  Procedure Laterality Date  . HEMANGIOMA EXCISION  04/2006  . SHOULDER SURGERY Left 2006    Current Outpatient Rx  . Order #: [id number] Class: Historical Med  . Order #: [id number] Class: Print    Allergies Albuterol and Ventolin [kdc:albuterol]  Family History  Problem Relation Age of Onset  . Hypertension Father   . Asthma Brother   . Cancer Paternal Grandmother        Breast    Social History Social History  Substance Use Topics  . Smoking status: Never Smoker  . Smokeless tobacco: Never Used  . Alcohol use 0.0 oz/week    Review of Systems  Constitutional: No fever/chills Eyes: No visual changes. ENT: No sore throat. Cardiovascular: Positive chest pain. Respiratory: Positive shortness of breath. Gastrointestinal: No abdominal pain.  No nausea, no  vomiting.  No diarrhea.  No constipation. Genitourinary: Negative for dysuria. Musculoskeletal: Negative for back pain. Skin: Negative for rash. Neurological: Negative for focal weakness or numbness. Positive headache.   10-point ROS otherwise negative.  ____________________________________________   PHYSICAL EXAM:  VITAL SIGNS: ED Triage Vitals  Enc Vitals Group     BP 03/28/17 1843 (!) 135/91     Pulse Rate 03/28/17 1843 73     Resp 03/28/17 1843 16     Temp 03/28/17 1843 97.6 F (36.4 C)     Temp Source 03/28/17 1843 Oral     SpO2 03/28/17 1843 100 %     Weight 03/28/17 1852 253 lb (114.8 kg)     Height 03/28/17 1852  (1.803 m)     Pain Score 03/28/17 1851 5   Constitutional: Alert and oriented. Appears uncomfortable.  Eyes: Conjunctivae are normal. PERRL. EOMI. Head: Atraumatic. Nose: No congestion/rhinnorhea. Mouth/Throat: Mucous membranes are moist.  Oropharynx non-erythematous. Neck: No stridor.   Cardiovascular: Normal rate, regular rhythm. Good peripheral circulation. Grossly normal heart sounds.   Respiratory: Normal respiratory effort.  No retractions. Lungs CTAB. Gastrointestinal: Soft and nontender. No distention.  Musculoskeletal: No lower extremity tenderness nor edema. No gross deformities of extremities. Neurologic:  Normal speech and language. No gross focal neurologic deficits are appreciated.  Skin:  Skin is warm, dry and intact. No rash noted.  ____________________________________________   LABS (all labs ordered are listed, but only abnormal results are displayed)  Labs Reviewed  COMPREHENSIVE METABOLIC PANEL - Abnormal; Notable for the following:       Result Value   CO2 20 (*)    Glucose, Bld 115 (*)    All other components within normal limits  CBC WITH DIFFERENTIAL/PLATELET - Abnormal; Notable for the following:    WBC 13.4 (*)    Neutro Abs 11.1 (*)    All other components within normal limits  LIPASE, BLOOD  D-DIMER,  QUANTITATIVE (NOT AT [facility])  CBC WITH DIFFERENTIAL/PLATELET  I-STAT TROPONIN, ED  I-STAT TROPONIN, ED   ____________________________________________  EKG   EKG Interpretation  Date/Time:  Friday March 28 2017 18:42:43 EDT Ventricular Rate:  72 PR Interval:    QRS Duration: 95 QT Interval:  412 QTC Calculation: 451 R Axis:   -16 Text Interpretation:  Sinus rhythm Borderline left axis deviation No STEMI.  Confirmed by Alona BeneLong, Joshua [phone]([id number]) on 03/28/2017 6:49:00 PM       ____________________________________________  RADIOLOGY  Dg Chest 2 View  Result Date: 03/28/2017 CLINICAL DATA:  Initial evaluation for acute chest pain. EXAM: CHEST  2 VIEW COMPARISON:  Prior radiograph from 01/15/2017. FINDINGS: The cardiac and mediastinal silhouettes are stable in size and contour, and remain within normal limits. The lungs are normally inflated. No airspace consolidation, pleural effusion, or pulmonary edema is identified. There is no pneumothorax. No acute osseous abnormality identified. IMPRESSION: No active cardiopulmonary disease. Electronically Signed   By: Rise MuBenjamin  McClintock M.D.   On: 03/28/2017 19:36    ____________________________________________   PROCEDURES  Procedure(s) performed:   Procedures  None ____________________________________________   INITIAL IMPRESSION / ASSESSMENT AND PLAN / ED COURSE  Pertinent labs & imaging results that were available during my care of the patient were reviewed by me and considered in my medical decision making (see chart for details).  Patient presents emergency department for evaluation of headache, chest pain, near syncope, nausea/vomiting. Patient's headache feels similar to migraine headaches. No sudden onset/maximal intensity headache. No reason to suspect intracranial hemorrhage. Patient with normal neurological exam. Does appear slightly uncomfortable. No recent travel or other significant PE risk factors. Plan for cardiac  evaluation. Added d-dimer.   Initial labs negative. Patient feeling much better. Leukocytosis of unknown etiology. Negative d-dimer. Will trend troponin and reassess.   11:26 PM Repeat troponin negative. Patient feeling much better and currently asymptomatic. Eager to go home. Will discharge with plan for PCP follow up PRN.   At this time, I do not feel there is any life-threatening condition present. I have reviewed and discussed all results (EKG, imaging, lab, urine as appropriate), exam findings with patient. I have reviewed nursing notes and appropriate previous records.  I feel the patient is safe to be discharged home without further emergent workup. Discussed usual and customary return precautions. Patient and family (if present) verbalize understanding and are comfortable with this plan.  Patient will follow-up with their primary care provider. If they do not have a primary care provider, information for follow-up has been provided to them. All questions have been answered.  ____________________________________________  FINAL CLINICAL IMPRESSION(S) / ED DIAGNOSES  Final diagnoses:  Precordial chest pain  Dyspnea, unspecified type  Acute non intractable tension-type headache  Non-intractable vomiting with nausea, unspecified vomiting type     MEDICATIONS GIVEN DURING THIS VISIT:  Medications  ondansetron (ZOFRAN) injection 4 mg (4 mg Intravenous Not Given 03/28/17 2105)  nitroGLYCERIN (NITROSTAT) SL tablet 0.4 mg (not administered)  sodium chloride 0.9 % bolus 500 mL (0 mLs Intravenous Stopped 03/28/17  2147)     NEW OUTPATIENT MEDICATIONS STARTED DURING THIS VISIT:  New Prescriptions   ONDANSETRON (ZOFRAN) 4 MG TABLET    Take 1 tablet (4 mg total) by mouth every 6 (six) hours.    Note:  This document was prepared using Dragon voice recognition software and may include unintentional dictation errors.  Alona Bene, MD Emergency Medicine    Long, Arlyss Repress, MD 03/28/17  [phone]

## 2017-03-28 NOTE — ED Notes (Signed)
Pt up to the br tolerated well

## 2017-03-28 NOTE — ED Notes (Signed)
Discharged by accident

## 2017-03-28 NOTE — ED Triage Notes (Signed)
[facility] reports the [place] their [place] complaining of 10 out of 10 chest pain, SOB, and a headache. EMS states the patient also vomitted 350cc's while in their care. EMS gave pt  of zofran and 324 of ASA. Pt refused nitro due to headache properties.   Pt states he had chest pain and SOB, then developed a headache followed by the nausea and subsequent vomiting. Pt denies any radiation of pain.

## 2017-03-28 NOTE — ED Notes (Signed)
No pain sl headache

## 2017-03-28 NOTE — Discharge Instructions (Signed)

## 2017-06-06 ENCOUNTER — Ambulatory Visit: Payer: BC Managed Care – PPO | Admitting: Family Medicine

## 2017-06-06 ENCOUNTER — Other Ambulatory Visit: Payer: Self-pay

## 2017-06-06 ENCOUNTER — Encounter: Payer: Self-pay | Admitting: Family Medicine

## 2017-06-06 VITALS — BP 118/60 | HR 93 | Temp 98.2°F | Resp 18 | Ht 70.0 in | Wt 254.0 lb

## 2017-06-06 DIAGNOSIS — Z09 Encounter for follow-up examination after completed treatment for conditions other than malignant neoplasm: Secondary | ICD-10-CM | POA: Diagnosis not present

## 2017-06-06 DIAGNOSIS — M25552 Pain in left hip: Secondary | ICD-10-CM

## 2017-06-06 MED ORDER — ALPRAZOLAM 0.5 MG PO TABS: 0.5000 mg | ORAL_TABLET | Freq: Three times a day (TID) | ORAL | 0 refills | Status: DC | PRN

## 2017-06-06 NOTE — Progress Notes (Signed)
Subjective:    Patient ID: Stephen Allen, male    DOB: [birth year], 41 y.o.   MRN: [id number]  HPI Since I last saw the patient, he has seen my partner in the summer for shortness of breath and chest pain.  Stress test was normal.  Thought was that he could be dealing with anxiety which the patient agrees.  Recently he has been experiencing more migraines as well which he also attributes to stress.  The most recent migraine occurred suddenly at work.  While driving home, the patient became extremely lightheaded and stopped at a local fire station.  They called EMS due to his near syncope where he was evaluated with negative troponins x2, a normal CBC.  He was diagnosed with a migraine and was discharged home.  This was in September.  He is here today for follow-up.  He states that he is suffered with migraines his entire life however recently his migraines have become much more frequent.  He also is having increasing anxiety attacks although it is slowly getting better over the last month.  He attributes both of these phenomenon to his new boss.  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  This is placing more work and more responsibilities on fewer and fewer people.  He believes this is leading to his shortness of breath, his atypical chest pain, and is more frequent migraines.  He also complains of hip pain in his left hip.  He has severe pain with flexion.  He also has pain with internal and external rotation.  He denies any specific injury.  This gradually occurred over a period of months. Past Medical History:  Diagnosis Date  . Asthma    Dx'd age 89  . Migraines    Past Surgical History:  Procedure Laterality Date  . HEMANGIOMA EXCISION  04/2006  . SHOULDER SURGERY Left 2006   No current outpatient medications on file prior to visit.   No current  facility-administered medications on file prior to visit.    Allergies  Allergen Reactions  . Albuterol     Migraine   . Ventolin [Kdc:Albuterol]     Migraines   Social History   Socioeconomic History  . Marital status: Married    Spouse name: Not on file  . Number of children: Not on file  . Years of education: Not on file  . Highest education level: Not on file  Social Needs  . Financial resource strain: Not on file  . Food insecurity - worry: Not on file  . Food insecurity - inability: Not on file  . Transportation needs - medical: Not on file  . Transportation needs - non-medical: Not on file  Occupational History  . Not on file  Tobacco Use  . Smoking status: Never Smoker  . Smokeless tobacco: Never Used  Substance and Sexual Activity  . Alcohol use: Yes    Alcohol/week: 0.0 oz  . Drug use: No  . Sexual activity: Not on file  Other Topics Concern  . Not on file  Social History Narrative  . Not on file      Review of Systems  All other systems reviewed and are negative.      Objective:   Physical Exam  Constitutional: He appears well-developed and well-nourished. No distress.  HENT:  Right Ear: External ear normal.  Left Ear: External ear normal.  Nose: Nose normal.  Mouth/Throat: Oropharynx is clear and moist. No oropharyngeal exudate.  Eyes: Conjunctivae are normal.  Neck: No thyromegaly present.  Cardiovascular: Normal rate, regular rhythm and normal heart sounds.  No murmur heard. Pulmonary/Chest: Effort normal and breath sounds normal. No respiratory distress. He has no wheezes. He has no rales.  Musculoskeletal:       Left hip: He exhibits decreased range of motion and tenderness. He exhibits normal strength.  Lymphadenopathy:    He has no cervical adenopathy.  Skin: He is not diaphoretic.  Vitals reviewed.         Assessment & Plan:  Left hip pain - Plan: DG HIP UNILAT WITH PELVIS 2-3 VIEWS LEFT  Hospital discharge follow-up  I  believe his anxiety over the stress at work and his new boss is triggering the worsening of his migraines and also causing his atypical chest pain and panic attacks.  We discussed treatment strategies including daily SSRI therapy for prevention or as needed medication for panic attacks.  At the present time, the stress levels are slowly improving and he would like to just have a prescription for Xanax which she can use sparingly on an as-needed basis or not at all.  I gave him 0.5 mg which he can take every 8 hours as needed.  I gave him 30 tablets of hopefully this will last more than a year.  Regarding his hip pain, I would like to proceed with an x-ray of the left hip to determine if he has osteoarthritis of the left hip.

## 2017-06-10 ENCOUNTER — Telehealth: Payer: Self-pay | Admitting: Family Medicine

## 2017-06-10 DIAGNOSIS — M25561 Pain in right knee: Secondary | ICD-10-CM

## 2017-06-10 NOTE — Telephone Encounter (Signed)
Pt called [facility] wanting to know since he was going to get xray of his hip if he could also get xray of his his rt knee since he was already going??

## 2017-06-10 NOTE — Telephone Encounter (Signed)
Order entered and pt aware

## 2017-06-10 NOTE — Telephone Encounter (Signed)
Ok to order 

## 2017-06-17 ENCOUNTER — Ambulatory Visit
Admission: RE | Admit: 2017-06-17 | Discharge: 2017-06-17 | Disposition: A | Discharge status: Home / Self Care | Payer: BC Managed Care – PPO | Source: Ambulatory Visit | Attending: Family Medicine | Admitting: Family Medicine

## 2017-06-17 DIAGNOSIS — M25552 Pain in left hip: Secondary | ICD-10-CM

## 2017-06-17 DIAGNOSIS — M25561 Pain in right knee: Secondary | ICD-10-CM

## 2017-07-15 ENCOUNTER — Encounter: Payer: Self-pay | Admitting: Physician Assistant

## 2017-07-15 NOTE — Progress Notes (Signed)
Cardiology Office Note    Date:  07/16/2017  ID:  Stephen BussingDaniel K Nave, DOB [birth year], MRN [id number] PCP:  Salley Scarleturham, Kawanta F, MD  Cardiologist:  Dr. Eden EmmsNishan  Chief Complaint: chest discomfort  History of Present Illness:  Stephen Allen is a 41 y.o. male with history of anxiety, obesity, asthma who returns for evaluation of chest pain. He was seen by Dr. Eden EmmsNishan in 02/2017 for evaluation of dyspnea for a year, chest tightness, fatigue, and anxiety. 2D echo 02/2017 showed EF 55-60%, normal diastolic function, possible HK of the basal mid anterolateral and apical lateral myocardium. ETT 02/2017 was normal, 9 min. He was seen in [facility] ED for similar symptoms at which time labs 03/2017 showed Hgb 14.2, d-dimer, troponin, lipase WNL, K 3.6, Cr 1.06, glucose 115, LFTs wnl. TSH wnl 12/2016.   He presents back for repeat evaluation of chest tightness. He reports that over the last few months he went from infrequently to more frequently having the sensation of occasional skips in his heartbeat accompanied by pressure in his chest. It now happens several times a day, sometimes up to half a day. He denies any tachy-palpitations but describes this more as a skip-flip. He cut down his caffeine to once a day and cut out stress at work but symptoms persist. He does report snoring but has never had sleep study. Whenever he does activity (lots of walking/stairs at work), he actually feels that symptoms improve.    Past Medical History:  Diagnosis Date  . Asthma    Dx'd age 89  . Migraines   . Obesity     Past Surgical History:  Procedure Laterality Date  . HEMANGIOMA EXCISION  04/2006  . SHOULDER SURGERY Left 2006    Current Medications: Current Meds  Medication Sig  . ibuprofen (ADVIL,MOTRIN) 200 MG tablet Take 200 mg by mouth every 6 (six) hours as needed.     Allergies:   Albuterol and Ventolin [kdc:albuterol]   Social History   Socioeconomic History  . Marital status: Married    Spouse name:  None  . Number of children: None  . Years of education: None  . Highest education level: None  Social Needs  . Financial resource strain: None  . Food insecurity - worry: None  . Food insecurity - inability: None  . Transportation needs - medical: None  . Transportation needs - non-medical: None  Occupational History  . None  Tobacco Use  . Smoking status: Never Smoker  . Smokeless tobacco: Never Used  Substance and Sexual Activity  . Alcohol use: Yes    Alcohol/week: 0.0 oz  . Drug use: No  . Sexual activity: None  Other Topics Concern  . None  Social History Narrative  . None     Family History:  Family History  Problem Relation Age of Onset  . Hypertension Father   . Asthma Brother   . Cancer Paternal Grandmother        Breast    ROS:   Please see the history of present illness.  All other systems are reviewed and otherwise negative.    PHYSICAL EXAM:   VS:  BP 120/80 (BP Location: Left Arm)   Pulse 83   Ht 5\' 10"  (1.778 m)   Wt 258 lb (117 kg)   SpO2 97%   BMI 37.02 kg/m   BMI: Body mass index is 37.02 kg/m. GEN: Well nourished, well developed obese WM, in no acute distress  HEENT: normocephalic, atraumatic Neck:  no JVD, carotid bruits, or masses Cardiac: RRR rare ectopy; no murmurs, rubs, or gallops, no edema  Respiratory:  clear to auscultation bilaterally, normal work of breathing GI: soft, nontender, nondistended, + BS MS: no deformity or atrophy  Skin: warm and dry, no rash Neuro:  Alert and Oriented x 3, Strength and sensation are intact, follows commands Psych: euthymic mood, full affect  Wt Readings from Last 3 Encounters:  07/16/17 258 lb (117 kg)  06/06/17 254 lb (115.2 kg)  03/28/17 253 lb (114.8 kg)      Studies/Labs Reviewed:   EKG:  EKG was ordered today and personally reviewed by me and demonstrates NSR 74bpm, TWI III, no acute changes  Recent Labs: 01/15/2017: TSH 2.84 03/28/2017: ALT 23; BUN 9; Creatinine, Ser 1.06; Hemoglobin  14.2; Platelets 283; Potassium 3.5; Sodium 136   Lipid Panel    Component Value Date/Time   CHOL 187 01/15/2017 1024   TRIG 328 (H) 01/15/2017 1024   HDL 35 (L) 01/15/2017 1024   CHOLHDL 5.3 (H) 01/15/2017 1024   VLDL 66 (H) 01/15/2017 1024   LDLCALC 86 01/15/2017 1024    Additional studies/ records that were reviewed today include: Summarized above.    ASSESSMENT & PLAN:   1. Chest tightness/palpitations - recent ischemic and structural workup was reassuring. Aside from question of possible wall motion abnormality on echo, his ETT was benign. His symptoms now seem more consistent with PACs or PVCs. I was able to auscultate one upon listening to him. Some patients perceive this as fullness/tightness. He also feels them in his throat at times. His symptoms improve with exertion. Will update BMET, Mg, TSH and arrange 48-hour Holter to help guide how best to further treat him - selective beta blocker is an option. Given his continued chest tightness sensation will also update BNP to exclude any contribution of covert fluid overload, although exam is benign.  2. H/o asthma - has not had any exacerbations recently, no active wheezing. If symptoms persist beyond cardiac eval, would maybe consider getting into see pulmonology. 3. Hyperglycemia - noted on ED labs 03/2017. Will reassess with above labs as it pertains to risk stratification. 4. Obesity - follow HR trend with Holter. If this shows nocturnal bradycardia would consider sleep study. We also discussed importance of long-term weight loss.  Disposition: F/u with Dr. Isac CaddyNishan/APP in 4 weeks,  Medication Adjustments/Labs and Tests Ordered: Current medicines are reviewed at length with the patient today.  Concerns regarding medicines are outlined above. Medication changes, Labs and Tests ordered today are summarized above and listed in the Patient Instructions accessible in Encounters.   Signed, Laurann Montanaayna N Dunn, PA-C  07/16/2017 2:17 PM      [facility] [place] [facility] in [facility] [address] [place], [place] [address] Ph: [phone]; Fax [phone]

## 2017-07-16 ENCOUNTER — Ambulatory Visit: Payer: BC Managed Care – PPO | Admitting: Physician Assistant

## 2017-07-16 ENCOUNTER — Encounter: Payer: Self-pay | Admitting: Physician Assistant

## 2017-07-16 ENCOUNTER — Other Ambulatory Visit (HOSPITAL_COMMUNITY)
Admission: RE | Admit: 2017-07-16 | Discharge: 2017-07-16 | Disposition: A | Discharge status: Home / Self Care | Payer: BC Managed Care – PPO | Source: Ambulatory Visit | Attending: Physician Assistant | Admitting: Physician Assistant

## 2017-07-16 VITALS — BP 120/80 | HR 83 | Ht 70.0 in | Wt 258.0 lb

## 2017-07-16 DIAGNOSIS — E669 Obesity, unspecified: Secondary | ICD-10-CM | POA: Insufficient documentation

## 2017-07-16 DIAGNOSIS — R799 Abnormal finding of blood chemistry, unspecified: Secondary | ICD-10-CM | POA: Insufficient documentation

## 2017-07-16 DIAGNOSIS — J45909 Unspecified asthma, uncomplicated: Secondary | ICD-10-CM | POA: Insufficient documentation

## 2017-07-16 DIAGNOSIS — R002 Palpitations: Secondary | ICD-10-CM

## 2017-07-16 DIAGNOSIS — R000 Tachycardia, unspecified: Secondary | ICD-10-CM | POA: Diagnosis present

## 2017-07-16 DIAGNOSIS — R0789 Other chest pain: Secondary | ICD-10-CM | POA: Insufficient documentation

## 2017-07-16 DIAGNOSIS — R739 Hyperglycemia, unspecified: Secondary | ICD-10-CM

## 2017-07-16 LAB — BASIC METABOLIC PANEL
Anion gap: 10 (ref 5–15)
BUN: 15 mg/dL (ref 6–20)
CALCIUM: 9.1 mg/dL (ref 8.9–10.3)
CHLORIDE: 102 mmol/L (ref 101–111)
CO2: 25 mmol/L (ref 22–32)
CREATININE: 1.09 mg/dL (ref 0.61–1.24)
Glucose, Bld: 98 mg/dL (ref 65–99)
Potassium: 4.1 mmol/L (ref 3.5–5.1)
SODIUM: 137 mmol/L (ref 135–145)

## 2017-07-16 LAB — TSH: TSH: 1.788 u[IU]/mL (ref 0.350–4.500)

## 2017-07-16 LAB — BRAIN NATRIURETIC PEPTIDE: B NATRIURETIC PEPTIDE 5: 14 pg/mL (ref 0.0–100.0)

## 2017-07-16 LAB — MAGNESIUM: Magnesium: 2 mg/dL (ref 1.7–2.4)

## 2017-07-16 NOTE — Patient Instructions (Signed)
Medication Instructions:  Your physician recommends that you continue on your current medications as directed. Please refer to the Current Medication list given to you today.   Labwork: Your physician recommends that you return for lab work in: Today    Testing/Procedures: Your physician has recommended that you wear a holter monitor. Holter monitors are medical devices that record the heart's electrical activity. Doctors most often use these monitors to diagnose arrhythmias. Arrhythmias are problems with the speed or rhythm of the heartbeat. The monitor is a small, portable device. You can wear one while you do your normal daily activities. This is usually used to diagnose what is causing palpitations/syncope (passing out).    Follow-Up: Your physician recommends that you schedule a follow-up appointment in: 4 Weeks    Any Other Special Instructions Will Be Listed Below (If Applicable).     If you need a refill on your cardiac medications before your next appointment, please call your pharmacy. Thank you for choosing [facility]!

## 2017-07-17 ENCOUNTER — Ambulatory Visit (HOSPITAL_COMMUNITY)
Admission: RE | Admit: 2017-07-17 | Discharge: 2017-07-17 | Disposition: A | Discharge status: Home / Self Care | Payer: BC Managed Care – PPO | Source: Ambulatory Visit | Attending: Physician Assistant | Admitting: Physician Assistant

## 2017-07-17 DIAGNOSIS — R002 Palpitations: Secondary | ICD-10-CM | POA: Insufficient documentation

## 2017-07-17 DIAGNOSIS — J45909 Unspecified asthma, uncomplicated: Secondary | ICD-10-CM | POA: Diagnosis not present

## 2017-07-17 DIAGNOSIS — E669 Obesity, unspecified: Secondary | ICD-10-CM | POA: Diagnosis not present

## 2017-07-17 DIAGNOSIS — R739 Hyperglycemia, unspecified: Secondary | ICD-10-CM | POA: Diagnosis not present

## 2017-07-17 DIAGNOSIS — I491 Atrial premature depolarization: Secondary | ICD-10-CM | POA: Diagnosis not present

## 2017-07-17 DIAGNOSIS — R0789 Other chest pain: Secondary | ICD-10-CM | POA: Insufficient documentation

## 2017-07-25 ENCOUNTER — Telehealth: Payer: Self-pay

## 2017-07-25 MED ORDER — METOPROLOL SUCCINATE ER 25 MG PO TB24: 25.0000 mg | ORAL_TABLET | Freq: Every day | ORAL | 6 refills | Status: DC

## 2017-07-25 NOTE — Telephone Encounter (Signed)
-----   Message from Laurann Montanaayna N Dunn, [place]C sent at 07/25/2017  2:40 PM EST ----- Please let patient know event monitor showed predominantly normal rhythm, but did show rare premature atrial contractions as suspected. This was a very small percentage of time. He had some lower heart rates during the overnight hours - not significant, but can be seen in sleep apnea, so would recommend he consider sleep study as we had discussed (order if he wants to proceed). We can try beta blocker therapy if hes still having symptoms. Given his history of asthma, lets try metoprolol XL 25mg  daily. Keep f/u as planned. Dayna Dunn PA-C

## 2017-07-25 NOTE — Telephone Encounter (Signed)
Spoke with pt., made him aware of monitor results. He stated that while wearing the monitor he only had a few episodes that he noticed. He voiced understanding of results. He did state that he would like to hold off on the sleep study at this time. He also stated his asthma is well controlled and he will try the Toprol XL 25 mg daily. I did advise him of things to look out for as far as breathing changes. He voiced understanding. Will sent in RX to [facility]. He stated he will keep follow up on 1/17.

## 2017-07-30 ENCOUNTER — Encounter (INDEPENDENT_AMBULATORY_CARE_PROVIDER_SITE_OTHER): Payer: BC Managed Care – PPO | Admitting: Ophthalmology

## 2017-07-30 DIAGNOSIS — H43813 Vitreous degeneration, bilateral: Secondary | ICD-10-CM

## 2017-07-30 DIAGNOSIS — M1612 Unilateral primary osteoarthritis, left hip: Secondary | ICD-10-CM | POA: Insufficient documentation

## 2017-07-30 DIAGNOSIS — H35711 Central serous chorioretinopathy, right eye: Secondary | ICD-10-CM | POA: Diagnosis not present

## 2017-08-01 ENCOUNTER — Telehealth: Payer: Self-pay | Admitting: Student

## 2017-08-01 NOTE — Telephone Encounter (Signed)
Patient was started on medication (patient doesn't remember name). Wants to know how long it will take to start working. Please advise. / tg

## 2017-08-01 NOTE — Telephone Encounter (Signed)
Patient still feels "irregular" heart rate mid morning, has one soda at lunch.Is going to take Toprol at bedtime instead if in the am, and stop the caffeine totally.he has apt on 1/17 and will let us know how he feels

## 2017-08-01 NOTE — Telephone Encounter (Signed)
Pt notified of ms.strader's message and agrees

## 2017-08-01 NOTE — Telephone Encounter (Signed)
Continue Toprol-XL at current dosing. Would not further increase as we need to make sure he is not having baseline bradycardia with the addition of the BB. Agree with caffeine and alcohol reduction (if consuming).   Thanks,  [place]

## 2017-08-06 NOTE — Progress Notes (Signed)
Cardiology Office Note    Date:  08/07/2017   ID:  Stephen Allen, DOB 10-23-1975, MRN [id number]  PCP:  Donita BrooksPickard, Warren T, MD  Cardiologist: Dr. Eden EmmsNishan   Chief Complaint  Patient presents with  . Follow-up    palpitations    History of Present Illness:    Stephen Allen is a 42 y.o. male with past medical history of anxiety, palpitations, and asthma who presents to the office today for 624-month follow-up.  He was last examined by Ronie Spiesayna Dunn, PA-C on 07/16/2017 and reported episodes of chest tightness for the past several months when he felt like his heart was "skipping beats". Symptoms were thought to be secondary to PAC's or PVC's and he had a 48-hour Holter monitor placed along with electrolytes and TSH being checked. Labs were found to be WNL and his monitor showed predominantly NSR with rare PAC's. Recent echo and ETT also showed no acute abnormalities. He was therefore started on Toprol-XL 25mg  daily to help with his palpitations.   In talking with the patient today, he reports having regular episodes of palpitations despite initiation of beta-blocker therapy. He says this feels like his heart is "flipping" in his chest. Episodes can last from several minutes to several hours. Denies any tachypalpitations. No associated dyspnea, lightheadedness, dizziness, or presyncope. He denies any recent chest discomfort, orthopnea, PND, or lower extremity edema.  He has completely eliminated caffeine and alcohol from his diet but continues to have these episodes. Reports good compliance with Toprol-XL and denies any noted side-effects to the medication.    Past Medical History:  Diagnosis Date  . Asthma    Dx'd age 89  . Migraines   . Obesity     Past Surgical History:  Procedure Laterality Date  . HEMANGIOMA EXCISION  04/2006  . SHOULDER SURGERY Left 2006    Current Medications: Outpatient Medications Prior to Visit  Medication Sig Dispense Refill  . ALPRAZolam (XANAX) 0.5 MG  tablet Take 1 tablet (0.5 mg total) 3 (three) times daily as needed by mouth for anxiety. 30 tablet 0  . ibuprofen (ADVIL,MOTRIN) 200 MG tablet Take 200 mg by mouth every 6 (six) hours as needed.    . metoprolol succinate (TOPROL XL) 25 MG 24 hr tablet Take 1 tablet (25 mg total) by mouth daily. 30 tablet 6   No facility-administered medications prior to visit.      Allergies:   Albuterol and Ventolin [kdc:albuterol]   Social History   Socioeconomic History  . Marital status: Married    Spouse name: None  . Number of children: None  . Years of education: None  . Highest education level: None  Social Needs  . Financial resource strain: None  . Food insecurity - worry: None  . Food insecurity - inability: None  . Transportation needs - medical: None  . Transportation needs - non-medical: None  Occupational History  . None  Tobacco Use  . Smoking status: Never Smoker  . Smokeless tobacco: Never Used  Substance and Sexual Activity  . Alcohol use: Yes    Alcohol/week: 0.0 oz  . Drug use: No  . Sexual activity: None  Other Topics Concern  . None  Social History Narrative  . None     Family History:  The patient's family history includes Asthma in his brother; Cancer in his paternal grandmother; Hypertension in his father.   Review of Systems:   Please see the history of present illness.  General:  No chills, fever, night sweats or weight changes.  Cardiovascular:  No chest pain, dyspnea on exertion, edema, orthopnea, paroxysmal nocturnal dyspnea. Positive for palpitations.  Dermatological: No rash, lesions/masses Respiratory: No cough, dyspnea Urologic: No hematuria, dysuria Abdominal:   No nausea, vomiting, diarrhea, bright red blood per rectum, melena, or hematemesis Neurologic:  No visual changes, wkns, changes in mental status. All other systems reviewed and are otherwise negative except as noted above.   Physical Exam:    VS:  BP 126/76 (BP Location: Left  Arm)   Pulse 85   Ht 5\' 10"  (1.778 m)   Wt 256 lb (116.1 kg)   SpO2 97%   BMI 36.73 kg/m    General: Well developed, well nourished Caucasian male appearing in no acute distress. Head: Normocephalic, atraumatic, sclera non-icteric, no xanthomas, nares are without discharge.  Neck: No carotid bruits. JVD not elevated.  Lungs: Respirations regular and unlabored, without wheezes or rales.  Heart: Regular rate and rhythm with occasional ectopic beats. No S3 or S4.  No murmur, no rubs, or gallops appreciated. Abdomen: Soft, non-tender, non-distended with normoactive bowel sounds. No hepatomegaly. No rebound/guarding. No obvious abdominal masses. Msk:  Strength and tone appear normal for age. No joint deformities or effusions. Extremities: No clubbing or cyanosis. No edema.  Distal pedal pulses are 2+ bilaterally. Neuro: Alert and oriented X 3. Moves all extremities spontaneously. No focal deficits noted. Psych:  Responds to questions appropriately with a normal affect. Skin: No rashes or lesions noted  Wt Readings from Last 3 Encounters:  08/07/17 256 lb (116.1 kg)  07/16/17 258 lb (117 kg)  06/06/17 254 lb (115.2 kg)     Studies/Labs Reviewed:   EKG:  EKG is not ordered today.   Recent Labs: 03/28/2017: ALT 23; Hemoglobin 14.2; Platelets 283 07/16/2017: B Natriuretic Peptide 14.0; BUN 15; Creatinine, Ser 1.09; Magnesium 2.0; Potassium 4.1; Sodium 137; TSH 1.788   Lipid Panel    Component Value Date/Time   CHOL 187 01/15/2017 1024   TRIG 328 (H) 01/15/2017 1024   HDL 35 (L) 01/15/2017 1024   CHOLHDL 5.3 (H) 01/15/2017 1024   VLDL 66 (H) 01/15/2017 1024   LDLCALC 86 01/15/2017 1024    Additional studies/ records that were reviewed today include:   Echocardiogram: 02/2017 Study Conclusions  - Left ventricle: The cavity size was normal. Wall thickness was   normal. Systolic function was normal. The estimated ejection   fraction was in the range of 55% to 60%. Left  ventricular   diastolic function parameters were normal. - Regional wall motion abnormality: Possible hypokinesis of the   basal-mid anterolateral and apical lateral myocardium. - Atrial septum: No defect or patent foramen ovale was identified.  ETT: 02/2017  Blood pressure demonstrated a hypertensive response to exercise.  There was no ST segment deviation noted during stress.  No T wave inversion was noted during stress.  No evidence of ischemia with good exercise tolerance.  Low Duke treadmill score portends a low risk of cardiac events.  Assessment:    1. Palpitations   2. PAC (premature atrial contraction)   3. Uncomplicated asthma, unspecified asthma severity, unspecified whether persistent      Plan:   In order of problems listed above:  1. Palpitations/ PAC's - the patient has experienced palpitations over the past several months and has undergone a thorough evaluation with recent ETT and echocardiogram showing no acute abnormalities. Recent Holter monitor showed predominantly NSR with rare PAC's. Recent labs showed  no acute electrolyte abnormalities with TSH being WNL. - he has continued to have episodes of palpitations despite being initiated on Toprol-XL 25mg  daily. Denies any tachypalpitations. No associated dyspnea, lightheadedness, dizziness, or presyncope.  - We reviewed in detail PAC's and PVC's. Recommended limiting alcohol and caffeine intake. Will further increase Toprol-XL to 50mg  daily. Informed the patient he can take an additional half-tablet if needed. If symptoms persist despite BB therapy, consider trial of Cardizem.   2. Asthma - denies any episodes of dyspnea or wheezing with initiation of BB therapy.    Medication Adjustments/Labs and Tests Ordered: Current medicines are reviewed at length with the patient today.  Concerns regarding medicines are outlined above.  Medication changes, Labs and Tests ordered today are listed in the Patient  Instructions below. Patient Instructions  Medication Instructions:  Your physician recommends that you continue on your current medications as directed. Please refer to the Current Medication list given to you today. Increase Toprol XL to 50 mg Daily   Labwork: NONE   Testing/Procedures: NONE   Follow-Up: Your physician recommends that you schedule a follow-up appointment in: 3 Months with Dr. Eden Emms   Any Other Special Instructions Will Be Listed Below (If Applicable).  If you need a refill on your cardiac medications before your next appointment, please call your pharmacy.  Thank you for choosing [facility]!    Signed, Ellsworth Lennox, PA-C  08/07/2017 5:42 PM    [facility] [address] [place], [place] [address] Phone: [phone]

## 2017-08-07 ENCOUNTER — Encounter: Payer: Self-pay | Admitting: Student

## 2017-08-07 ENCOUNTER — Ambulatory Visit: Payer: BC Managed Care – PPO | Admitting: Student

## 2017-08-07 VITALS — BP 126/76 | HR 85 | Ht 70.0 in | Wt 256.0 lb

## 2017-08-07 DIAGNOSIS — R002 Palpitations: Secondary | ICD-10-CM

## 2017-08-07 DIAGNOSIS — J45909 Unspecified asthma, uncomplicated: Secondary | ICD-10-CM | POA: Diagnosis not present

## 2017-08-07 DIAGNOSIS — I491 Atrial premature depolarization: Secondary | ICD-10-CM

## 2017-08-07 MED ORDER — METOPROLOL SUCCINATE ER 50 MG PO TB24: 50.0000 mg | ORAL_TABLET | Freq: Every day | ORAL | 3 refills | Status: DC

## 2017-08-07 NOTE — Patient Instructions (Signed)
Medication Instructions:  Your physician recommends that you continue on your current medications as directed. Please refer to the Current Medication list given to you today. Increase Toprol XL to 50 mg Daily   Labwork: NONE   Testing/Procedures: NONE   Follow-Up: Your physician recommends that you schedule a follow-up appointment in: 3 Months with Dr. Eden EmmsNishan    Any Other Special Instructions Will Be Listed Below (If Applicable).     If you need a refill on your cardiac medications before your next appointment, please call your pharmacy.  Thank you for choosing [facility]!

## 2017-08-28 ENCOUNTER — Encounter (INDEPENDENT_AMBULATORY_CARE_PROVIDER_SITE_OTHER): Payer: BC Managed Care – PPO | Admitting: Ophthalmology

## 2017-08-28 DIAGNOSIS — H43813 Vitreous degeneration, bilateral: Secondary | ICD-10-CM

## 2017-08-28 DIAGNOSIS — H35371 Puckering of macula, right eye: Secondary | ICD-10-CM | POA: Diagnosis not present

## 2017-08-28 DIAGNOSIS — H25013 Cortical age-related cataract, bilateral: Secondary | ICD-10-CM | POA: Diagnosis not present

## 2017-10-08 ENCOUNTER — Ambulatory Visit: Payer: BC Managed Care – PPO | Admitting: Cardiovascular Disease

## 2017-10-16 ENCOUNTER — Encounter (INDEPENDENT_AMBULATORY_CARE_PROVIDER_SITE_OTHER): Payer: BC Managed Care – PPO | Admitting: Ophthalmology

## 2017-10-16 DIAGNOSIS — H43813 Vitreous degeneration, bilateral: Secondary | ICD-10-CM

## 2017-10-16 DIAGNOSIS — H35711 Central serous chorioretinopathy, right eye: Secondary | ICD-10-CM | POA: Diagnosis not present

## 2017-12-04 ENCOUNTER — Ambulatory Visit: Payer: BC Managed Care – PPO | Admitting: Family Medicine

## 2017-12-04 ENCOUNTER — Encounter: Payer: Self-pay | Admitting: Family Medicine

## 2017-12-04 VITALS — BP 130/76 | HR 93 | Temp 98.7°F | Resp 14 | Ht 70.0 in | Wt 257.0 lb

## 2017-12-04 DIAGNOSIS — J069 Acute upper respiratory infection, unspecified: Secondary | ICD-10-CM | POA: Diagnosis not present

## 2017-12-04 MED ORDER — HYDROCODONE-HOMATROPINE 5-1.5 MG/5ML PO SYRP: 5.0000 mL | ORAL_SOLUTION | Freq: Three times a day (TID) | ORAL | 0 refills | Status: DC | PRN

## 2017-12-04 MED ORDER — FLUTICASONE PROPIONATE 50 MCG/ACT NA SUSP: 2.0000 | Freq: Every day | NASAL | 6 refills | Status: DC

## 2017-12-04 MED ORDER — LEVOCETIRIZINE DIHYDROCHLORIDE 5 MG PO TABS: 5.0000 mg | ORAL_TABLET | Freq: Every evening | ORAL | 2 refills | Status: DC

## 2017-12-04 NOTE — Progress Notes (Signed)
Subjective:    Patient ID: Stephen Allen, male    DOB: 1975-07-24, 42 y.o.   MRN: [id number]  HPI Symptoms began last night with a burning painful sensation in the back of his throat and in the back of his "sinuses."  He reports postnasal drip.  He states that it is draining down his throat causing him to cough.  The cough is productive of some yellowish sputum yesterday and into this morning.  Cough kept him awake all last night.  He denies any fever.  He denies any wheezing.  He denies any chest pain.  He denies any severe shortness of breath.  He denies any hemoptysis.  He denies any sinus pain.  The sore throat is better this morning.  He denies any significant rhinorrhea although he does report postnasal drip. Past Medical History:  Diagnosis Date  . Asthma    Dx'd age 65  . Migraines   . Obesity    Past Surgical History:  Procedure Laterality Date  . HEMANGIOMA EXCISION  04/2006  . SHOULDER SURGERY Left 2006   Current Outpatient Medications on File Prior to Visit  Medication Sig Dispense Refill  . ALPRAZolam (XANAX) 0.5 MG tablet Take 1 tablet (0.5 mg total) 3 (three) times daily as needed by mouth for anxiety. 30 tablet 0  . eplerenone (INSPRA) 50 MG tablet Take 50 mg by mouth daily.     . Glucos-Chondroit-Hyaluron-MSM (GLUCOSAMINE CHONDROITIN JOINT PO) Take by mouth.    Marland Kitchen ibuprofen (ADVIL,MOTRIN) 200 MG tablet Take 200 mg by mouth every 6 (six) hours as needed.    . naproxen sodium (ALEVE) 220 MG tablet Take 220 mg by mouth daily as needed.    . metoprolol succinate (TOPROL-XL) 50 MG 24 hr tablet Take 1 tablet (50 mg total) by mouth daily. Take with or immediately following a meal. 90 tablet 3   No current facility-administered medications on file prior to visit.    Allergies  Allergen Reactions  . Albuterol     Migraine   . Ventolin [Kdc:Albuterol]     Migraines   Social History   Socioeconomic History  . Marital status: Married    Spouse name: Not on file  .  Number of children: Not on file  . Years of education: Not on file  . Highest education level: Not on file  Occupational History  . Not on file  Social Needs  . Financial resource strain: Not on file  . Food insecurity:    Worry: Not on file    Inability: Not on file  . Transportation needs:    Medical: Not on file    Non-medical: Not on file  Tobacco Use  . Smoking status: Never Smoker  . Smokeless tobacco: Never Used  Substance and Sexual Activity  . Alcohol use: Yes    Alcohol/week: 0.0 oz  . Drug use: No  . Sexual activity: Not on file  Lifestyle  . Physical activity:    Days per week: Not on file    Minutes per session: Not on file  . Stress: Not on file  Relationships  . Social connections:    Talks on phone: Not on file    Gets together: Not on file    Attends religious service: Not on file    Active member of club or organization: Not on file    Attends meetings of clubs or organizations: Not on file    Relationship status: Not on file  . Intimate partner  violence:    Fear of current or ex partner: Not on file    Emotionally abused: Not on file    Physically abused: Not on file    Forced sexual activity: Not on file  Other Topics Concern  . Not on file  Social History Narrative  . Not on file     Review of Systems  All other systems reviewed and are negative.      Objective:   Physical Exam  Constitutional: He appears well-developed and well-nourished. No distress.  HENT:  Right Ear: Tympanic membrane, external ear and ear canal normal.  Left Ear: Tympanic membrane, external ear and ear canal normal.  Nose: Mucosal edema and rhinorrhea present.  Mouth/Throat: Oropharynx is clear and moist. No oropharyngeal exudate.  Cardiovascular: Normal rate, regular rhythm and normal heart sounds. Exam reveals no gallop and no friction rub.  No murmur heard. Pulmonary/Chest: Effort normal and breath sounds normal. No stridor. No respiratory distress. He has no  wheezes. He has no rales.  Lymphadenopathy:    He has no cervical adenopathy.  Skin: He is not diaphoretic.  Vitals reviewed.         Assessment & Plan:  Viral upper respiratory tract infection - Plan: HYDROcodone-homatropine (HYCODAN) 5-1.5 MG/5ML syrup, levocetirizine (XYZAL) 5 MG tablet, fluticasone (FLONASE) 50 MCG/ACT nasal spray  Patient's history and exam is more consistent with a viral upper respiratory infection versus allergies.  I will treat the patient is an upper respiratory infection with decongestants.  Use Xyzal 5 mg p.o. daily as well as Flonase 2 sprays each nostril daily.  Use Hycodan 1 teaspoon every 6 hours as needed for cough.  Recommended waiting 5 to 6 days and I anticipate symptoms will slowly resolve on their own over that timeframe.  If symptoms worsen such that he develops high fever, purulent sputum, etc. recheck immediately.  Patient also then discussed chronic body pain all over his body.  He has been seeing orthopedist since age 44.  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  He has seen a rheumatologist in the past who told him he had osteoarthritis.  However his pain is out of proportion to what one would expect at his age and with osteoarthritis.  I suspect fibromyalgia.  Spent 10 minutes discussing this with him.  In the future we could try Lyrica if he would like to see if it would help mitigate some of his pain.

## 2018-01-02 ENCOUNTER — Telehealth: Payer: Self-pay | Admitting: Family Medicine

## 2018-01-02 NOTE — Telephone Encounter (Signed)
Pt states that the he was told to call in once he was feeling better, he is done with his sickness and has finished his medication that was prescribed on 12/04/2017. States he was told to call in because you thought he had possible fibromyalgia and that you would prescribe something for it. Please call.

## 2018-01-05 ENCOUNTER — Other Ambulatory Visit: Payer: Self-pay | Admitting: Family Medicine

## 2018-01-05 MED ORDER — PREGABALIN 75 MG PO CAPS: 75.0000 mg | ORAL_CAPSULE | Freq: Two times a day (BID) | ORAL | 0 refills | Status: DC

## 2018-01-05 NOTE — Telephone Encounter (Signed)
I would try lyrica 75 mg pobid for possible fibromyalgia.  I escribed to his pharmacy.

## 2018-01-05 NOTE — Telephone Encounter (Signed)
LMTRC

## 2018-01-05 NOTE — Telephone Encounter (Signed)
Per Dr. Tanya NonesPickard Caution patient that lyrica can cause dizziness and sedation when first started.

## 2018-01-12 NOTE — Telephone Encounter (Signed)
LMTRC

## 2018-08-06 ENCOUNTER — Encounter: Payer: Self-pay | Admitting: Family Medicine

## 2018-08-06 ENCOUNTER — Ambulatory Visit: Payer: BC Managed Care – PPO | Admitting: Family Medicine

## 2018-08-06 VITALS — BP 136/88 | HR 70 | Temp 97.9°F | Resp 18 | Ht 70.0 in | Wt 248.0 lb

## 2018-08-06 DIAGNOSIS — M2550 Pain in unspecified joint: Secondary | ICD-10-CM | POA: Diagnosis not present

## 2018-08-06 MED ORDER — PREGABALIN 75 MG PO CAPS: 75.0000 mg | ORAL_CAPSULE | Freq: Two times a day (BID) | ORAL | 0 refills | Status: DC

## 2018-08-06 NOTE — Progress Notes (Signed)
Subjective:    Patient ID: Stephen Allen, male    DOB: 1976-05-12, 43 y.o.   MRN: [id number]  HPI  11/2017 Symptoms began last night with a burning painful sensation in the back of his throat and in the back of his "sinuses."  He reports postnasal drip.  He states that it is draining down his throat causing him to cough.  The cough is productive of some yellowish sputum yesterday and into this morning.  Cough kept him awake all last night.  He denies any fever.  He denies any wheezing.  He denies any chest pain.  He denies any severe shortness of breath.  He denies any hemoptysis.  He denies any sinus pain.  The sore throat is better this morning.  He denies any significant rhinorrhea although he does report postnasal drip.  At that time, my plan was: Patient's history and exam is more consistent with a viral upper respiratory infection versus allergies.  I will treat the patient is an upper respiratory infection with decongestants.  Use Xyzal 5 mg p.o. daily as well as Flonase 2 sprays each nostril daily.  Use Hycodan 1 teaspoon every 6 hours as needed for cough.  Recommended waiting 5 to 6 days and I anticipate symptoms will slowly resolve on their own over that timeframe.  If symptoms worsen such that he develops high fever, purulent sputum, etc. recheck immediately.  Patient also then discussed chronic body pain all over his body.  He has been seeing orthopedist since age 69.  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  He has seen a rheumatologist in the past who told him he had osteoarthritis.  However his pain is out of proportion to what one would expect at his age and with osteoarthritis.  I suspect fibromyalgia.  Spent 10 minutes discussing this with him.  In the future we could try Lyrica if he would like to see if it would help mitigate some of his pain.  08/06/2018 Patient never tried the Lyrica.  He is here today to discuss options for pain control.  He  states that he has been dealing with arthritis since he was 13.  He is seen numerous orthopedist at [facility] and has been diagnosed with osteoarthritis in his hips as well as in his knees.  He had shoulder surgery on his left shoulder.  He deals with chronic pain in both shoulders left greater than right, both hips, and both knees.  He also has a history of chronic low back pain.  He states that his back frequently when out on him when he was younger.  He would take Flexeril and hydrocodone as needed for back pain and back spasms.  He has not taken any of that medication in last 3 years however he is using ibuprofen 2-3 times every day.  Patient states that he lives in constant pain.  The pain involves his lower back, his thighs, his knees, even his shins hurt.  He also has pain in both shoulders as well as in his hands.  He denies any fevers chills or rashes.  He was even sent to see a rheumatologist however he does not remember any blood panel that was drawn to evaluate for autoimmune diseases. Past Medical History:  Diagnosis Date  . Asthma    Dx'd age 77  . Migraines   . Obesity    Past Surgical History:  Procedure Laterality Date  . HEMANGIOMA EXCISION  04/2006  . SHOULDER SURGERY Left 2006   Current Outpatient Medications on File Prior to Visit  Medication Sig Dispense Refill  . ALPRAZolam (XANAX) 0.5 MG tablet Take 1 tablet (0.5 mg total) 3 (three) times daily as needed by mouth for anxiety. 30 tablet 0  . Glucos-Chondroit-Hyaluron-MSM (GLUCOSAMINE CHONDROITIN JOINT PO) Take by mouth.    Marland Kitchen. ibuprofen (ADVIL,MOTRIN) 200 MG tablet Take 200 mg by mouth every 6 (six) hours as needed. 600 in am - 400 in pm    . metoprolol succinate (TOPROL-XL) 50 MG 24 hr tablet Take 1 tablet (50 mg total) by mouth daily. Take with or immediately following a meal. (Patient taking differently: Take 50 mg by mouth daily. Takes prn) 90 tablet 3  . pregabalin (LYRICA) 75 MG capsule Take 1 capsule (75 mg  total) by mouth 2 (two) times daily. 60 capsule 0   No current facility-administered medications on file prior to visit.    Allergies  Allergen Reactions  . Albuterol     Migraine   . Ventolin [Kdc:Albuterol]     Migraines   Social History   Socioeconomic History  . Marital status: Married    Spouse name: Not on file  . Number of children: Not on file  . Years of education: Not on file  . Highest education level: Not on file  Occupational History  . Not on file  Social Needs  . Financial resource strain: Not on file  . Food insecurity:    Worry: Not on file    Inability: Not on file  . Transportation needs:    Medical: Not on file    Non-medical: Not on file  Tobacco Use  . Smoking status: Never Smoker  . Smokeless tobacco: Never Used  Substance and Sexual Activity  . Alcohol use: Yes    Alcohol/week: 0.0 standard drinks  . Drug use: No  . Sexual activity: Not on file  Lifestyle  . Physical activity:    Days per week: Not on file    Minutes per session: Not on file  . Stress: Not on file  Relationships  . Social connections:    Talks on phone: Not on file    Gets together: Not on file    Attends religious service: Not on file    Active member of club or organization: Not on file    Attends meetings of clubs or organizations: Not on file    Relationship status: Not on file  . Intimate partner violence:    Fear of current or ex partner: Not on file    Emotionally abused: Not on file    Physically abused: Not on file    Forced sexual activity: Not on file  Other Topics Concern  . Not on file  Social History Narrative  . Not on file     Review of Systems  All other systems reviewed and are negative.      Objective:   Physical Exam Vitals signs reviewed.  Constitutional:      General: He is not in acute distress.    Appearance: Normal appearance. He is well-developed. He is obese. He is not diaphoretic.  Cardiovascular:     Rate and Rhythm: Normal  rate and regular rhythm.     Heart sounds: Normal heart sounds. No murmur. No friction rub. No gallop.   Pulmonary:     Effort: Pulmonary effort is normal.     Breath sounds: Normal breath sounds.  Lymphadenopathy:  Cervical: No cervical adenopathy.  Neurological:     Mental Status: He is alert.           Assessment & Plan:  Polyarthralgia - Plan: Sedimentation rate, C-reactive protein, Rheumatoid factor, ANA  Spent 15 to 20 minutes today with the patient discussing his past history.  I am concerned about the level of arthritis he has in such a relatively young age.  He was extremely active when he was younger.  He competed in track, was a Camera operator, was a Event organiser.  Therefore is possible that repetitive trauma could have expedited his osteoarthritis and then his weight is only exacerbated the problem.  However him also concerned there may be an underlying autoimmune illness or possibly even an element of fibromyalgia.  Therefore we will try empirically Lyrica 75 mg p.o. twice daily and see if this helps his pain to avoid habit-forming medications.  I will also evaluate for autoimmune disease with a sedimentation rate, CRP, rheumatoid factor, and an ANA.  If lab work is negative and Lyrica shows no benefit, consider trying Cymbalta versus trying tramadol.

## 2018-08-07 LAB — SEDIMENTATION RATE: SED RATE: 2 mm/h (ref 0–15)

## 2018-08-07 LAB — ANA: Anti Nuclear Antibody(ANA): NEGATIVE

## 2018-08-07 LAB — C-REACTIVE PROTEIN: CRP: 1.4 mg/L (ref <8.0)

## 2018-08-07 LAB — RHEUMATOID FACTOR: Rhuematoid fact SerPl-aCnc: <14 IU/mL (ref <14)

## 2018-08-12 ENCOUNTER — Encounter: Payer: Self-pay | Admitting: Family Medicine

## 2018-09-05 ENCOUNTER — Other Ambulatory Visit: Payer: Self-pay | Admitting: Family Medicine

## 2018-09-07 NOTE — Telephone Encounter (Signed)
Requesting refill    Lyrica  LOV: 08/06/2018  LRF:  08/06/2018

## 2018-09-18 IMAGING — DX DG CHEST 2V
2 series · 2 of 2 positions shown · non-contrast
Comparison: Prior radiograph from 01/15/2017.

CLINICAL DATA: Initial evaluation for acute chest pain.

EXAM:
CHEST  2 VIEW

[w chest pa]
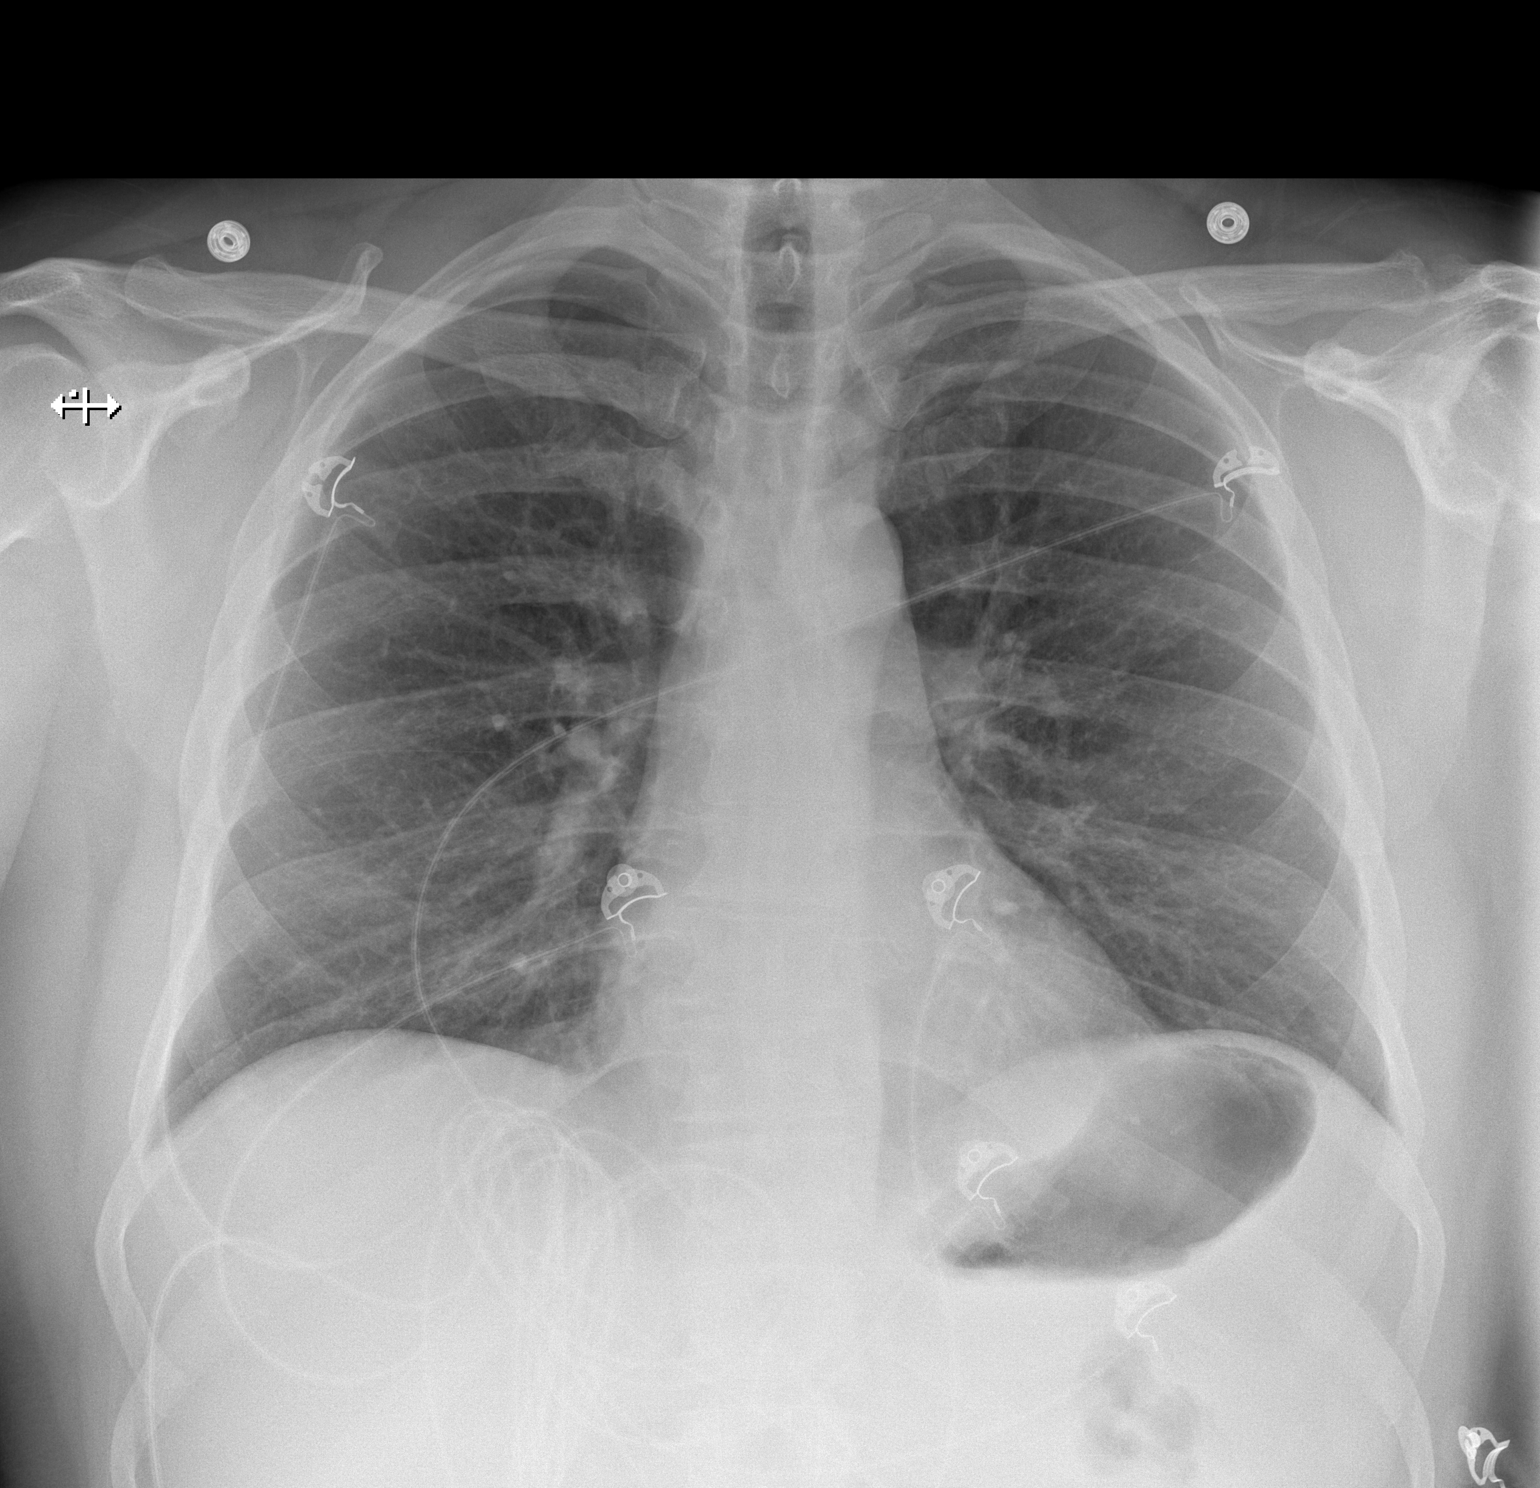

[w chest lat]
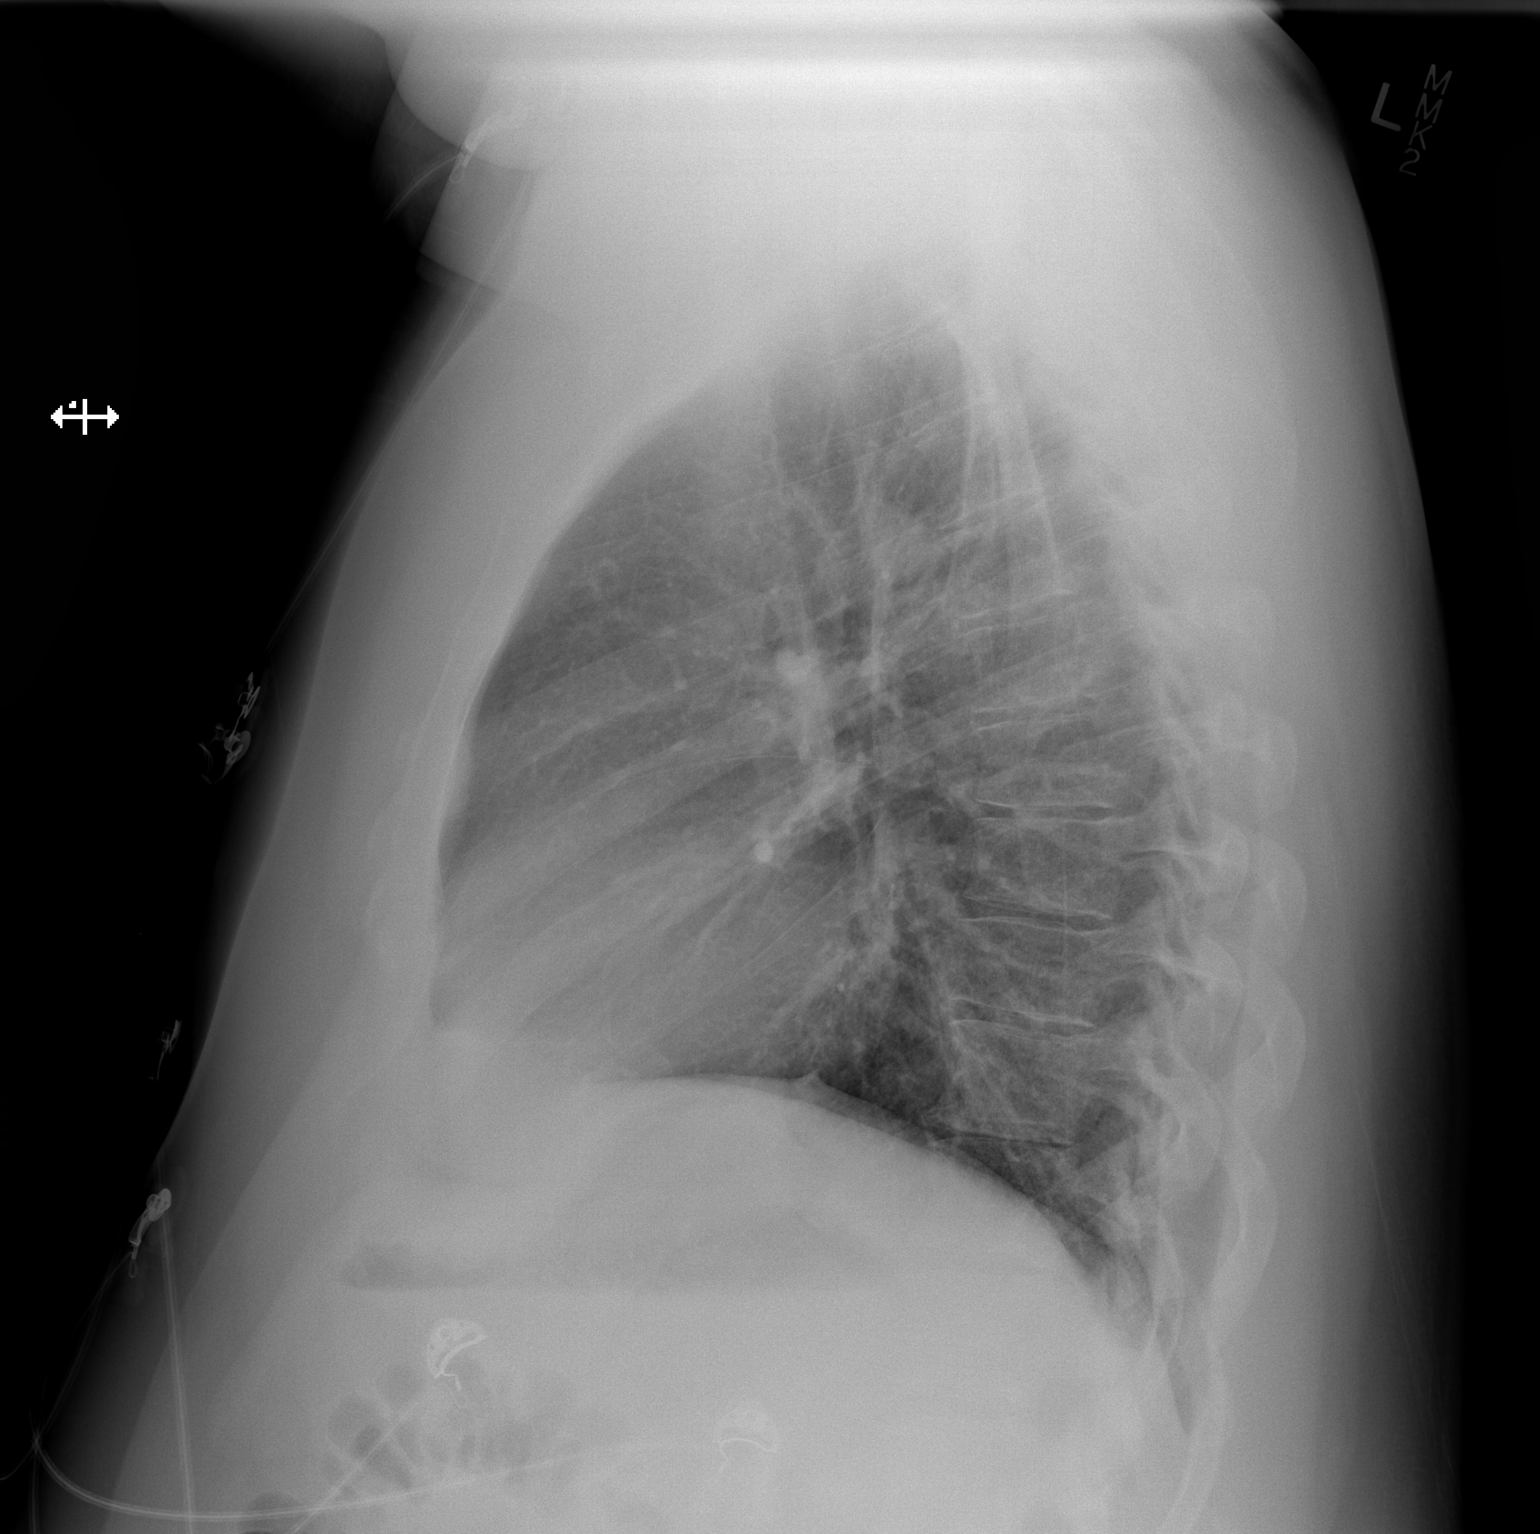

[2 of 2 positions shown; findings below may reference images not displayed]

FINDINGS: The cardiac and mediastinal silhouettes are stable in size and
contour, and remain within normal limits.

The lungs are normally inflated. No airspace consolidation, pleural
effusion, or pulmonary edema is identified. There is no
pneumothorax.

No acute osseous abnormality identified.
IMPRESSION: No active cardiopulmonary disease.

## 2018-09-23 DIAGNOSIS — M25561 Pain in right knee: Secondary | ICD-10-CM | POA: Insufficient documentation

## 2018-10-23 DIAGNOSIS — H359 Unspecified retinal disorder: Secondary | ICD-10-CM | POA: Insufficient documentation

## 2018-10-29 ENCOUNTER — Other Ambulatory Visit: Payer: Self-pay | Admitting: Family Medicine

## 2019-02-01 ENCOUNTER — Encounter: Payer: Self-pay | Admitting: Family Medicine

## 2019-02-01 ENCOUNTER — Other Ambulatory Visit: Payer: Self-pay

## 2019-02-01 ENCOUNTER — Ambulatory Visit: Payer: BC Managed Care – PPO | Admitting: Family Medicine

## 2019-02-01 VITALS — BP 152/100 | HR 72 | Temp 98.6°F | Resp 16 | Ht 70.0 in | Wt 249.0 lb

## 2019-02-01 DIAGNOSIS — M2550 Pain in unspecified joint: Secondary | ICD-10-CM | POA: Diagnosis not present

## 2019-02-01 DIAGNOSIS — I10 Essential (primary) hypertension: Secondary | ICD-10-CM | POA: Diagnosis not present

## 2019-02-01 DIAGNOSIS — M797 Fibromyalgia: Secondary | ICD-10-CM

## 2019-02-01 MED ORDER — MELOXICAM 15 MG PO TABS: 15.0000 mg | ORAL_TABLET | Freq: Every day | ORAL | 11 refills | Status: DC

## 2019-02-01 MED ORDER — LOSARTAN POTASSIUM 50 MG PO TABS: 50.0000 mg | ORAL_TABLET | Freq: Every day | ORAL | 3 refills | Status: DC

## 2019-02-01 MED ORDER — PREGABALIN 100 MG PO CAPS: 100.0000 mg | ORAL_CAPSULE | Freq: Two times a day (BID) | ORAL | 5 refills | Status: DC

## 2019-02-02 LAB — COMPLETE METABOLIC PANEL WITH GFR
AG Ratio: 1.7 (calc) (ref 1.0–2.5)
ALT: 24 U/L (ref 9–46)
AST: 20 U/L (ref 10–40)
Albumin: 4.4 g/dL (ref 3.6–5.1)
Alkaline phosphatase (APISO): 79 U/L (ref 36–130)
BUN: 12 mg/dL (ref 7–25)
CO2: 25 mmol/L (ref 20–32)
Calcium: 9.5 mg/dL (ref 8.6–10.3)
Chloride: 104 mmol/L (ref 98–110)
Creat: 0.93 mg/dL (ref 0.60–1.35)
GFR, Est African American: 116 mL/min/{1.73_m2} (ref >=60)
GFR, Est Non African American: 100 mL/min/{1.73_m2} (ref >=60)
Globulin: 2.6 g/dL (calc) (ref 1.9–3.7)
Glucose, Bld: 88 mg/dL (ref 65–99)
Potassium: 4.6 mmol/L (ref 3.5–5.3)
Sodium: 138 mmol/L (ref 135–146)
Total Bilirubin: 0.4 mg/dL (ref 0.2–1.2)
Total Protein: 7 g/dL (ref 6.1–8.1)

## 2019-02-02 LAB — CBC WITH DIFFERENTIAL/PLATELET
Absolute Monocytes: 624 cells/uL (ref 200–950)
Basophils Absolute: 59 cells/uL (ref 0–200)
Basophils Relative: 0.9 %
Eosinophils Absolute: 202 cells/uL (ref 15–500)
Eosinophils Relative: 3.1 %
HCT: 43.1 % (ref 38.5–50.0)
Hemoglobin: 14.8 g/dL (ref 13.2–17.1)
Lymphs Abs: 2366 cells/uL (ref 850–3900)
MCH: 29.2 pg (ref 27.0–33.0)
MCHC: 34.3 g/dL (ref 32.0–36.0)
MCV: 85 fL (ref 80.0–100.0)
MPV: 10.3 fL (ref 7.5–12.5)
Monocytes Relative: 9.6 %
Neutro Abs: 3250 cells/uL (ref 1500–7800)
Neutrophils Relative %: 50 %
Platelets: 284 10*3/uL (ref 140–400)
RBC: 5.07 10*6/uL (ref 4.20–5.80)
RDW: 13 % (ref 11.0–15.0)
Total Lymphocyte: 36.4 %
WBC: 6.5 10*3/uL (ref 3.8–10.8)

## 2019-02-02 NOTE — Progress Notes (Signed)
Subjective:    Patient ID: Stephen Allen, male    DOB: Jul 08, 1976, 43 y.o.   MRN: [id number]  HPI  11/2017 Symptoms began last night with a burning painful sensation in the back of his throat and in the back of his "sinuses."  He reports postnasal drip.  He states that it is draining down his throat causing him to cough.  The cough is productive of some yellowish sputum yesterday and into this morning.  Cough kept him awake all last night.  He denies any fever.  He denies any wheezing.  He denies any chest pain.  He denies any severe shortness of breath.  He denies any hemoptysis.  He denies any sinus pain.  The sore throat is better this morning.  He denies any significant rhinorrhea although he does report postnasal drip.  At that time, my plan was: Patient's history and exam is more consistent with a viral upper respiratory infection versus allergies.  I will treat the patient is an upper respiratory infection with decongestants.  Use Xyzal 5 mg p.o. daily as well as Flonase 2 sprays each nostril daily.  Use Hycodan 1 teaspoon every 6 hours as needed for cough.  Recommended waiting 5 to 6 days and I anticipate symptoms will slowly resolve on their own over that timeframe.  If symptoms worsen such that he develops high fever, purulent sputum, etc. recheck immediately.  Patient also then discussed chronic body pain all over his body.  He has been seeing orthopedist since age 60.  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  He has seen a rheumatologist in the past who told him he had osteoarthritis.  However his pain is out of proportion to what one would expect at his age and with osteoarthritis.  I suspect fibromyalgia.  Spent 10 minutes discussing this with him.  In the future we could try Lyrica if he would like to see if it would help mitigate some of his pain.  08/06/2018 Patient never tried the Lyrica.  He is here today to discuss options for pain control.  He  states that he has been dealing with arthritis since he was 61.  He is seen numerous orthopedist at [place] and has been diagnosed with osteoarthritis in his hips as well as in his knees.  He had shoulder surgery on his left shoulder.  He deals with chronic pain in both shoulders left greater than right, both hips, and both knees.  He also has a history of chronic low back pain.  He states that his back frequently when out on him when he was younger.  He would take Flexeril and hydrocodone as needed for back pain and back spasms.  He has not taken any of that medication in last 3 years however he is using ibuprofen 2-3 times every day.  Patient states that he lives in constant pain.  The pain involves his lower back, his thighs, his knees, even his shins hurt.  He also has pain in both shoulders as well as in his hands.  He denies any fevers chills or rashes.  He was even sent to see a rheumatologist however he does not remember any blood panel that was drawn to evaluate for autoimmune diseases.  At that time, my plan was: Spent 15 to 20 minutes today with the patient discussing his past history.  I am concerned about the level of arthritis he has in such a relatively young  age.  He was extremely active when he was younger.  He competed in track, was a Camera operatorpower lifter, was a Event organiserhigh jumper.  Therefore is possible that repetitive trauma could have expedited his osteoarthritis and then his weight is only exacerbated the problem.  However him also concerned there may be an underlying autoimmune illness or possibly even an element of fibromyalgia.  Therefore we will try empirically Lyrica 75 mg p.o. twice daily and see if this helps his pain to avoid habit-forming medications.  I will also evaluate for autoimmune disease with a sedimentation rate, CRP, rheumatoid factor, and an ANA.  If lab work is negative and Lyrica shows no benefit, consider trying Cymbalta versus trying tramadol.  02/01/19 Patient is  here today for follow-up.  He has seen definite benefit since starting Lyrica 75 mg twice daily.  He denies any sedation.  He denies any dizziness.  He continues to suffer pain all over his body.  Autoimmune work-up was negative.  He still takes meloxicam daily for joint pain.  He continues to report pain all over his body.  He is interested in possibly increasing the Lyrica to achieve better pain control and improve quality of life.  We discussed the risk of gastrointestinal bleeding as well as possible renal damage due to NSAID overuse.  Gastrointestinal side effects.  His blood pressure today is elevated.  He states that he gets similar numbers at home.  Typically his systolic blood pressure is higher than 140 and his diastolic blood pressure is higher than 90.  He denies any chest pain shortness of breath or dyspnea on exertion. Past Medical History:  Diagnosis Date  . Asthma    Dx'd age 89  . Migraines   . Obesity    Past Surgical History:  Procedure Laterality Date  . HEMANGIOMA EXCISION  04/2006  . SHOULDER SURGERY Left 2006   Current Outpatient Medications on File Prior to Visit  Medication Sig Dispense Refill  . ALPRAZolam (XANAX) 0.5 MG tablet Take 1 tablet (0.5 mg total) 3 (three) times daily as needed by mouth for anxiety. 30 tablet 0  . Glucos-Chondroit-Hyaluron-MSM (GLUCOSAMINE CHONDROITIN JOINT PO) Take by mouth.    Marland Kitchen. ibuprofen (ADVIL,MOTRIN) 200 MG tablet Take 200 mg by mouth every 6 (six) hours as needed. 600 in am - 400 in pm    . metoprolol succinate (TOPROL-XL) 50 MG 24 hr tablet Take 1 tablet (50 mg total) by mouth daily. Take with or immediately following a meal. (Patient taking differently: Take 50 mg by mouth daily. Takes prn) 90 tablet 3  . PROAIR HFA 108 (90 Base) MCG/ACT inhaler USE 2 PUFFS EVERY 4 HOURS AS NEEDED. 8.5 g 0   No current facility-administered medications on file prior to visit.    Allergies  Allergen Reactions  . Albuterol     Migraine   . Ventolin  [Kdc:Albuterol]     Migraines   Social History   Socioeconomic History  . Marital status: Married    Spouse name: Not on file  . Number of children: Not on file  . Years of education: Not on file  . Highest education level: Not on file  Occupational History  . Not on file  Social Needs  . Financial resource strain: Not on file  . Food insecurity    Worry: Not on file    Inability: Not on file  . Transportation needs    Medical: Not on file    Non-medical: Not on file  Tobacco  Use  . Smoking status: Never Smoker  . Smokeless tobacco: Never Used  Substance and Sexual Activity  . Alcohol use: Yes    Alcohol/week: 0.0 standard drinks  . Drug use: No  . Sexual activity: Not on file  Lifestyle  . Physical activity    Days per week: Not on file    Minutes per session: Not on file  . Stress: Not on file  Relationships  . Social Musicianconnections    Talks on phone: Not on file    Gets together: Not on file    Attends religious service: Not on file    Active member of club or organization: Not on file    Attends meetings of clubs or organizations: Not on file    Relationship status: Not on file  . Intimate partner violence    Fear of current or ex partner: Not on file    Emotionally abused: Not on file    Physically abused: Not on file    Forced sexual activity: Not on file  Other Topics Concern  . Not on file  Social History Narrative  . Not on file     Review of Systems  Musculoskeletal: Positive for arthritis.  All other systems reviewed and are negative.      Objective:   Physical Exam Vitals signs reviewed.  Constitutional:      General: He is not in acute distress.    Appearance: Normal appearance. He is well-developed. He is obese. He is not diaphoretic.  Cardiovascular:     Rate and Rhythm: Normal rate and regular rhythm.     Heart sounds: Normal heart sounds. No murmur. No friction rub. No gallop.   Pulmonary:     Effort: Pulmonary effort is normal.      Breath sounds: Normal breath sounds.  Lymphadenopathy:     Cervical: No cervical adenopathy.  Neurological:     Mental Status: He is alert.           Assessment & Plan:  The primary encounter diagnosis was Benign essential HTN. Diagnoses of Polyarthralgia and Fibromyalgia were also pertinent to this visit. Blood pressure is elevated.  Begin losartan 50 mg a day and recheck blood pressure in 1 month.  I believe the patient's polyarthralgia and polymyalgia's are due to fibromyalgia.  I recommended increasing Lyrica to 100 mg twice daily.  He can continue meloxicam 15 mg daily however we did discuss the risk of gastrointestinal bleeding and kidney damage due to NSAID overuse.  Patient elects to continue the medication due to the benefit he sees from taking the daily NSAID.

## 2019-02-26 ENCOUNTER — Other Ambulatory Visit: Payer: Self-pay | Admitting: Student

## 2019-04-26 ENCOUNTER — Encounter: Payer: Self-pay | Admitting: Family Medicine

## 2019-04-27 ENCOUNTER — Other Ambulatory Visit: Payer: Self-pay | Admitting: Family Medicine

## 2019-04-27 MED ORDER — HYDROCHLOROTHIAZIDE 25 MG PO TABS: 25.0000 mg | ORAL_TABLET | Freq: Every day | ORAL | 3 refills | Status: DC

## 2019-05-31 ENCOUNTER — Other Ambulatory Visit: Payer: Self-pay | Admitting: Family Medicine

## 2019-05-31 MED ORDER — ALPRAZOLAM 0.5 MG PO TABS: 0.5000 mg | ORAL_TABLET | Freq: Three times a day (TID) | ORAL | 0 refills | Status: DC | PRN

## 2019-05-31 NOTE — Telephone Encounter (Signed)
Last office visit: 02/01/2019 Last refill: 06/06/2017

## 2019-06-07 ENCOUNTER — Other Ambulatory Visit: Payer: Self-pay

## 2019-06-07 DIAGNOSIS — Z20822 Contact with and (suspected) exposure to covid-19: Secondary | ICD-10-CM

## 2019-06-09 ENCOUNTER — Encounter: Payer: Self-pay | Admitting: Family Medicine

## 2019-06-09 LAB — NOVEL CORONAVIRUS, NAA: SARS-CoV-2, NAA: NOT DETECTED

## 2019-06-09 NOTE — Telephone Encounter (Signed)
Ok to refill??  Medication was not sent to pharmacy. Prescription was printed.

## 2019-06-10 MED ORDER — ALPRAZOLAM 0.5 MG PO TABS: 0.5000 mg | ORAL_TABLET | Freq: Three times a day (TID) | ORAL | 0 refills | Status: DC | PRN

## 2019-07-15 ENCOUNTER — Encounter: Payer: Self-pay | Admitting: Family Medicine

## 2019-08-15 ENCOUNTER — Ambulatory Visit (INDEPENDENT_AMBULATORY_CARE_PROVIDER_SITE_OTHER): Payer: BC Managed Care – PPO

## 2019-08-15 ENCOUNTER — Ambulatory Visit: Payer: BC Managed Care – PPO

## 2019-08-15 ENCOUNTER — Ambulatory Visit
Admission: EM | Admit: 2019-08-15 | Discharge: 2019-08-15 | Disposition: A | Discharge status: Home / Self Care | Payer: BC Managed Care – PPO | Attending: Emergency Medicine | Admitting: Emergency Medicine

## 2019-08-15 ENCOUNTER — Other Ambulatory Visit: Payer: Self-pay

## 2019-08-15 DIAGNOSIS — M25541 Pain in joints of right hand: Secondary | ICD-10-CM

## 2019-08-15 DIAGNOSIS — S62524A Nondisplaced fracture of distal phalanx of right thumb, initial encounter for closed fracture: Secondary | ICD-10-CM

## 2019-08-15 DIAGNOSIS — S6991XA Unspecified injury of right wrist, hand and finger(s), initial encounter: Secondary | ICD-10-CM

## 2019-08-15 MED ORDER — HYDROCODONE-ACETAMINOPHEN 5-325 MG PO TABS: 1.0000 | ORAL_TABLET | Freq: Two times a day (BID) | ORAL | 0 refills | Status: DC | PRN

## 2019-08-15 NOTE — Discharge Instructions (Addendum)
X-rays positive for nondisplaced fracture proximal metaphysis distal phalanx of the right thumb Continue conservative management of rest, ice, and elevation Thumb spica aplint applied Alternate meloxicam and tylenol  Norco for severe break-through pain.  DO NOT TAKE prior to driving Follow up with orthopedist for further evaluation and management Return or go to the ER if you have any new or worsening symptoms (fever, chills, chest pain, redness, swelling, discharge, etc...)

## 2019-08-15 NOTE — ED Provider Notes (Signed)
[facility]   [id number] 08/15/19 Arrival Time: 1440  CC: thumb pain/ injury  SUBJECTIVE: History from: patient. Stephen Allen is a 44 y.o. male complains of RT thumb pain/ injury that began 1 day ago.  Symptoms began after [place].  Localizes the pain to the RT thumb.  Describes the pain as constant, throbbing and intermittently sharp in character.  Has tried NOT tried OTC medications.  Symptoms are made worse with ROM.  Denies similar symptoms in the past.  Complains of associated bruising, swelling, weakness, and tingling.  Denies fever, chills, erythema.    ROS: As per HPI.  All other pertinent ROS negative.     Past Medical History:  Diagnosis Date  . Asthma    Dx'd age 83  . Migraines   . Obesity    Past Surgical History:  Procedure Laterality Date  . HEMANGIOMA EXCISION  04/2006  . SHOULDER SURGERY Left 2006   Allergies  Allergen Reactions  . Albuterol     Migraine   . Ventolin [Kdc:Albuterol]     Migraines   No current facility-administered medications on file prior to encounter.   Current Outpatient Medications on File Prior to Encounter  Medication Sig Dispense Refill  . ALPRAZolam (XANAX) 0.5 MG tablet Take 1 tablet (0.5 mg total) by mouth 3 (three) times daily as needed for anxiety. 30 tablet 0  . Glucos-Chondroit-Hyaluron-MSM (GLUCOSAMINE CHONDROITIN JOINT PO) Take by mouth.    . hydrochlorothiazide (HYDRODIURIL) 25 MG tablet Take 1 tablet (25 mg total) by mouth daily. 90 tablet 3  . ibuprofen (ADVIL,MOTRIN) 200 MG tablet Take 200 mg by mouth every 6 (six) hours as needed. 600 in am - 400 in pm    . losartan (COZAAR) 50 MG tablet Take 1 tablet (50 mg total) by mouth daily. 90 tablet 3  . meloxicam (MOBIC) 15 MG tablet Take 1 tablet (15 mg total) by mouth daily. 30 tablet 11  . metoprolol succinate (TOPROL-XL) 50 MG 24 hr tablet TAKE (1) TABLET BY MOUTH ONCE DAILY. 90 tablet 0  . pregabalin (LYRICA) 100 MG  capsule Take 1 capsule (100 mg total) by mouth 2 (two) times daily. 60 capsule 5  . PROAIR HFA 108 (90 Base) MCG/ACT inhaler USE 2 PUFFS EVERY 4 HOURS AS NEEDED. 8.5 g 0   Social History   Socioeconomic History  . Marital status: Married    Spouse name: Not on file  . Number of children: Not on file  . Years of education: Not on file  . Highest education level: Not on file  Occupational History  . Not on file  Tobacco Use  . Smoking status: Never Smoker  . Smokeless tobacco: Never Used  Substance and Sexual Activity  . Alcohol use: Yes    Alcohol/week: 0.0 standard drinks  . Drug use: No  . Sexual activity: Not on file  Other Topics Concern  . Not on file  Social History Narrative  . Not on file   Social Determinants of Health   Financial Resource Strain:   . Difficulty of Paying Living Expenses: Not on file  Food Insecurity:   . Worried About Programme researcher, broadcasting/film/video in the Last Year: Not on file  . Ran Out of Food in the Last Year: Not on file  Transportation Needs:   . Lack of Transportation (Medical): Not on file  . Lack of Transportation (Non-Medical): Not on file  Physical Activity:   . Days  of Exercise per Week: Not on file  . Minutes of Exercise per Session: Not on file  Stress:   . Feeling of Stress : Not on file  Social Connections:   . Frequency of Communication with Friends and Family: Not on file  . Frequency of Social Gatherings with Friends and Family: Not on file  . Attends Religious Services: Not on file  . Active Member of Clubs or Organizations: Not on file  . Attends Archivist Meetings: Not on file  . Marital Status: Not on file  Intimate Partner Violence:   . Fear of Current or Ex-Partner: Not on file  . Emotionally Abused: Not on file  . Physically Abused: Not on file  . Sexually Abused: Not on file   Family History  Problem Relation Age of Onset  . Hypertension Father   . Asthma Brother   . Cancer Paternal Grandmother         Breast    OBJECTIVE:  Vitals:   08/15/19 1448  BP: 133/77  Pulse: 69  Resp: 17  Temp: 98.6 F (37 C)  TempSrc: Tympanic  SpO2: 95%    General appearance: ALERT; in no acute distress.  Head: NCAT Lungs: Normal respiratory effort CV: XX pulses 2+ bilaterally. Cap refill < 2 seconds Musculoskeletal: RT hand Inspection: ecchymosis and swelling to distal thumb Palpation: TTP over distal 1st digit ROM: LROM about the thumb Strength: decreased grip strength Skin: warm and dry Neurologic: Ambulates without difficulty; Sensation intact about the upper extremities Psychological: alert and cooperative; normal mood and affect  DIAGNOSTIC STUDIES:  DG Finger Thumb Right  Result Date: 08/15/2019 CLINICAL DATA:  Right thumb pain since an injury snow tubing yesterday. Initial encounter. EXAM: RIGHT THUMB 2+V COMPARISON:  None. FINDINGS: The patient has an acute transverse fracture through the proximal metaphysis of the distal phalanx of the thumb. The fracture is nondisplaced and does not involve the articular surface. IMPRESSION: Nondisplaced fracture proximal metaphysis distal phalanx of the right thumb. Electronically Signed   By: Inge Rise M.D.   On: 08/15/2019 14:59      X-rays positive for distal phalanx fracture  I have reviewed the x-rays myself and the radiologist interpretation. I am in agreement with the radiologist interpretation.     ASSESSMENT & PLAN:  1. Closed nondisplaced fracture of distal phalanx of right thumb, initial encounter   2. Injury of right thumb, initial encounter     Meds ordered this encounter  Medications  . HYDROcodone-acetaminophen (NORCO/VICODIN) 5-325 MG tablet    Sig: Take 1 tablet by mouth every 12 (twelve) hours as needed for severe pain.    Dispense:  10 tablet    Refill:  0    Order Specific Question:   Supervising Provider    Answer:   Raylene Everts [[id number]]   X-rays positive for nondisplaced fracture proximal metaphysis  distal phalanx of the right thumb Continue conservative management of rest, ice, and elevation Thumb spica aplint applied Alternate meloxicam and tylenol  Norco for severe break-through pain.  DO NOT TAKE prior to driving Follow up with orthopedist for further evaluation and management Return or go to the ER if you have any new or worsening symptoms (fever, chills, chest pain, redness, swelling, discharge, etc...)   Reviewed expectations re: course of current medical issues. Questions answered. Outlined signs and symptoms indicating need for more acute intervention. Patient verbalized understanding. After Visit Summary given.    Lestine Box, PA-C 08/15/19 1533

## 2019-08-15 NOTE — ED Triage Notes (Signed)
Pt injured right thumb  snow tubing yesterday , bruising noted

## 2019-08-19 ENCOUNTER — Other Ambulatory Visit: Payer: Self-pay | Admitting: Family Medicine

## 2019-08-19 DIAGNOSIS — S62609A Fracture of unspecified phalanx of unspecified finger, initial encounter for closed fracture: Secondary | ICD-10-CM | POA: Insufficient documentation

## 2019-08-19 NOTE — Telephone Encounter (Signed)
Requesting refill    Lyrica   LOV: 02/01/19  LRF:  02/01/19

## 2019-08-23 DIAGNOSIS — M1711 Unilateral primary osteoarthritis, right knee: Secondary | ICD-10-CM | POA: Insufficient documentation

## 2020-02-25 ENCOUNTER — Other Ambulatory Visit: Payer: Self-pay | Admitting: Family Medicine

## 2020-02-25 NOTE — Telephone Encounter (Signed)
Ok to refill??  Last office visit 02/01/2019.  Last refill 08/19/2019, #5 refills.  Letter sent to patient to schedule OV.

## 2020-02-29 ENCOUNTER — Other Ambulatory Visit: Payer: Self-pay

## 2020-02-29 ENCOUNTER — Ambulatory Visit: Payer: BC Managed Care – PPO | Admitting: Family Medicine

## 2020-02-29 VITALS — BP 114/60 | HR 86 | Temp 98.2°F | Ht 69.0 in | Wt 244.0 lb

## 2020-02-29 DIAGNOSIS — M791 Myalgia, unspecified site: Secondary | ICD-10-CM | POA: Diagnosis not present

## 2020-02-29 MED ORDER — PANTOPRAZOLE SODIUM 40 MG PO TBEC: 40.0000 mg | DELAYED_RELEASE_TABLET | Freq: Every day | ORAL | 3 refills | Status: DC

## 2020-02-29 NOTE — Progress Notes (Signed)
Subjective:    Patient ID: Stephen Allen, male    DOB: 1976/04/22, 44 y.o.   MRN: [id number]  Arthritis    11/2017 Symptoms began last night with a burning painful sensation in the back of his throat and in the back of his "sinuses."  He reports postnasal drip.  He states that it is draining down his throat causing him to cough.  The cough is productive of some yellowish sputum yesterday and into this morning.  Cough kept him awake all last night.  He denies any fever.  He denies any wheezing.  He denies any chest pain.  He denies any severe shortness of breath.  He denies any hemoptysis.  He denies any sinus pain.  The sore throat is better this morning.  He denies any significant rhinorrhea although he does report postnasal drip.  At that time, my plan was: Patient's history and exam is more consistent with a viral upper respiratory infection versus allergies.  I will treat the patient is an upper respiratory infection with decongestants.  Use Xyzal 5 mg p.o. daily as well as Flonase 2 sprays each nostril daily.  Use Hycodan 1 teaspoon every 6 hours as needed for cough.  Recommended waiting 5 to 6 days and I anticipate symptoms will slowly resolve on their own over that timeframe.  If symptoms worsen such that he develops high fever, purulent sputum, etc. recheck immediately.  Patient also then discussed chronic body pain all over his body.  He has been seeing orthopedist since age 57.  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  He has seen a rheumatologist in the past who told him he had osteoarthritis.  However his pain is out of proportion to what one would expect at his age and with osteoarthritis.  I suspect fibromyalgia.  Spent 10 minutes discussing this with him.  In the future we could try Lyrica if he would like to see if it would help mitigate some of his pain.  08/06/2018 Patient never tried the Lyrica.  He is here today to discuss options for pain  control.  He states that he has been dealing with arthritis since he was 13.  He is seen numerous orthopedist at [facility] and has been diagnosed with osteoarthritis in his hips as well as in his knees.  He had shoulder surgery on his left shoulder.  He deals with chronic pain in both shoulders left greater than right, both hips, and both knees.  He also has a history of chronic low back pain.  He states that his back frequently when out on him when he was younger.  He would take Flexeril and hydrocodone as needed for back pain and back spasms.  He has not taken any of that medication in last 3 years however he is using ibuprofen 2-3 times every day.  Patient states that he lives in constant pain.  The pain involves his lower back, his thighs, his knees, even his shins hurt.  He also has pain in both shoulders as well as in his hands.  He denies any fevers chills or rashes.  He was even sent to see a rheumatologist however he does not remember any blood panel that was drawn to evaluate for autoimmune diseases.  At that time, my plan was: Spent 15 to 20 minutes today with the patient discussing his past history.  I am concerned about the level of arthritis he has in such a  relatively young age.  He was extremely active when he was younger.  He competed in track, was a Camera operatorpower lifter, was a Event organiserhigh jumper.  Therefore is possible that repetitive trauma could have expedited his osteoarthritis and then his weight is only exacerbated the problem.  However him also concerned there may be an underlying autoimmune illness or possibly even an element of fibromyalgia.  Therefore we will try empirically Lyrica 75 mg p.o. twice daily and see if this helps his pain to avoid habit-forming medications.  I will also evaluate for autoimmune disease with a sedimentation rate, CRP, rheumatoid factor, and an ANA.  If lab work is negative and Lyrica shows no benefit, consider trying Cymbalta versus trying  tramadol.  02/01/19 Patient is here today for follow-up.  He has seen definite benefit since starting Lyrica 75 mg twice daily.  He denies any sedation.  He denies any dizziness.  He continues to suffer pain all over his body.  Autoimmune work-up was negative.  He still takes meloxicam daily for joint pain.  He continues to report pain all over his body.  He is interested in possibly increasing the Lyrica to achieve better pain control and improve quality of life.  We discussed the risk of gastrointestinal bleeding as well as possible renal damage due to NSAID overuse.  Gastrointestinal side effects.  His blood pressure today is elevated.  He states that he gets similar numbers at home.  Typically his systolic blood pressure is higher than 140 and his diastolic blood pressure is higher than 90.  He denies any chest pain shortness of breath or dyspnea on exertion.  02/29/20 Patient returns today complaining of pain all over his body.  He states that he has a migratory pain that seems to change on a day-to-day basis.  Sometimes he will hurt in his hips.  Sometimes he will hurt in his thighs.  Sometimes he will hurt in his calves.  At other times he will hurt in his shoulders.  The pain seems to move to different areas on a daily basis.  He reports moderate to severe pain all over his body.  There is no specific one location that hurts the worst.  Previously I worked the patient up for autoimmune diseases with normal lab work.  Initially he saw benefit from Lyrica however patient denies any help with pain at the present time.  He denies any rashes.  He denies any shortness of breath although he has been getting some substernal chest pressure.  This occurs after eating usually within an hour.  He will feel a burning pressure-like sensation near the xiphoid process.  He is taking meloxicam on a daily basis due to pain.  He denies any melena or hematochezia. Past Medical History:  Diagnosis Date  . Asthma    Dx'd  age 89  . Migraines   . Obesity    Past Surgical History:  Procedure Laterality Date  . HEMANGIOMA EXCISION  04/2006  . SHOULDER SURGERY Left 2006   Current Outpatient Medications on File Prior to Visit  Medication Sig Dispense Refill  . ALPRAZolam (XANAX) 0.5 MG tablet Take 1 tablet (0.5 mg total) by mouth 3 (three) times daily as needed for anxiety. 30 tablet 0  . Glucos-Chondroit-Hyaluron-MSM (GLUCOSAMINE CHONDROITIN JOINT PO) Take by mouth.    . hydrochlorothiazide (HYDRODIURIL) 25 MG tablet Take 1 tablet (25 mg total) by mouth daily. 90 tablet 3  . HYDROcodone-acetaminophen (NORCO/VICODIN) 5-325 MG tablet Take 1 tablet by mouth every  12 (twelve) hours as needed for severe pain. 10 tablet 0  . ibuprofen (ADVIL,MOTRIN) 200 MG tablet Take 200 mg by mouth every 6 (six) hours as needed. 600 in am - 400 in pm    . losartan (COZAAR) 50 MG tablet Take 1 tablet (50 mg total) by mouth daily. 90 tablet 3  . meloxicam (MOBIC) 15 MG tablet TAKE 1 TABLET BY MOUTH ONCE DAILY WITH A MEAL. 30 tablet 0  . metoprolol succinate (TOPROL-XL) 50 MG 24 hr tablet TAKE (1) TABLET BY MOUTH ONCE DAILY. 90 tablet 0  . pregabalin (LYRICA) 100 MG capsule TAKE (1) CAPSULE BY MOUTH TWICE DAILY. 60 capsule 0  . PROAIR HFA 108 (90 Base) MCG/ACT inhaler USE 2 PUFFS EVERY 4 HOURS AS NEEDED. 8.5 g 0   No current facility-administered medications on file prior to visit.   Allergies  Allergen Reactions  . Albuterol     Migraine   . Ventolin [Kdc:Albuterol]     Migraines   Social History   Socioeconomic History  . Marital status: Married    Spouse name: Not on file  . Number of children: Not on file  . Years of education: Not on file  . Highest education level: Not on file  Occupational History  . Not on file  Tobacco Use  . Smoking status: Never Smoker  . Smokeless tobacco: Never Used  Vaping Use  . Vaping Use: Never used  Substance and Sexual Activity  . Alcohol use: Yes    Alcohol/week: 0.0 standard  drinks  . Drug use: No  . Sexual activity: Not on file  Other Topics Concern  . Not on file  Social History Narrative  . Not on file   Social Determinants of Health   Financial Resource Strain:   . Difficulty of Paying Living Expenses:   Food Insecurity:   . Worried About Programme researcher, broadcasting/film/video in the Last Year:   . Barista in the Last Year:   Transportation Needs:   . Freight forwarder (Medical):   Marland Kitchen Lack of Transportation (Non-Medical):   Physical Activity:   . Days of Exercise per Week:   . Minutes of Exercise per Session:   Stress:   . Feeling of Stress :   Social Connections:   . Frequency of Communication with Friends and Family:   . Frequency of Social Gatherings with Friends and Family:   . Attends Religious Services:   . Active Member of Clubs or Organizations:   . Attends Banker Meetings:   Marland Kitchen Marital Status:   Intimate Partner Violence:   . Fear of Current or Ex-Partner:   . Emotionally Abused:   Marland Kitchen Physically Abused:   . Sexually Abused:      Review of Systems  Musculoskeletal: Positive for arthritis.  All other systems reviewed and are negative.      Objective:   Physical Exam Vitals reviewed.  Constitutional:      General: He is not in acute distress.    Appearance: Normal appearance. He is well-developed. He is obese. He is not diaphoretic.  Cardiovascular:     Rate and Rhythm: Normal rate and regular rhythm.     Heart sounds: Normal heart sounds. No murmur heard.  No friction rub. No gallop.   Pulmonary:     Effort: Pulmonary effort is normal.     Breath sounds: Normal breath sounds.  Lymphadenopathy:     Cervical: No cervical adenopathy.  Neurological:  Mental Status: He is alert.           Assessment & Plan:  Myalgia - Plan: CBC with Differential/Platelet, COMPLETE METABOLIC PANEL WITH GFR, CK, Rheumatoid factor, ANA, Sedimentation rate, B. burgdorfi antibodies by WB  Again I will repeat a basic  laboratory panel to evaluate for possible autoimmune diseases.  Given the fact the pain is so widespread and migratory in nature I do not think this would be simple osteoarthritis.  I will check a CBC to evaluate for any bone marrow abnormalities.  I will check a CK level to evaluate for any evidence of myositis.  I will check a rheumatoid factor along with an ANA and a sedimentation rate.  I will screen the patient for Lyme disease.  If lab work is normal, I would recommend a second opinion with rheumatology and if they agree, I would believe the patient has a myofascial pain syndrome or fibromyalgia.  I will also start the patient on Protonix 40 mg a day for the discomfort at the distal end of his xiphoid process that occurs after eating.  I suspect that he may have some mild gastritis due to NSAID overuse however he states that if he stops the meloxicam the pain becomes unbearable.  Patient also has a cystlike mass at the tip of his left third toe.  It is just distal to the IP joint near the toenail.  It is roughly 3 mm in diameter.  It is a cystlike lesion that appears to be a digital mucous cyst.  The patient is comfortable simply watching it.  If it starts to grow I would recommend an excision under the care of a podiatrist

## 2020-03-01 LAB — COMPLETE METABOLIC PANEL WITH GFR
AG Ratio: 2 (calc) (ref 1.0–2.5)
ALT: 16 U/L (ref 9–46)
AST: 14 U/L (ref 10–40)
Albumin: 4.5 g/dL (ref 3.6–5.1)
Alkaline phosphatase (APISO): 68 U/L (ref 36–130)
BUN: 12 mg/dL (ref 7–25)
CO2: 27 mmol/L (ref 20–32)
Calcium: 9.4 mg/dL (ref 8.6–10.3)
Chloride: 102 mmol/L (ref 98–110)
Creat: 0.97 mg/dL (ref 0.60–1.35)
GFR, Est African American: 110 mL/min/{1.73_m2} (ref >=60)
GFR, Est Non African American: 95 mL/min/{1.73_m2} (ref >=60)
Globulin: 2.3 g/dL (calc) (ref 1.9–3.7)
Glucose, Bld: 91 mg/dL (ref 65–99)
Potassium: 4 mmol/L (ref 3.5–5.3)
Sodium: 139 mmol/L (ref 135–146)
Total Bilirubin: 0.6 mg/dL (ref 0.2–1.2)
Total Protein: 6.8 g/dL (ref 6.1–8.1)

## 2020-03-03 LAB — B. BURGDORFI ANTIBODIES BY WB
B burgdorferi IgG Abs (IB): NEGATIVE
B burgdorferi IgM Abs (IB): NEGATIVE
Lyme Disease 18 kD IgG: NONREACTIVE
Lyme Disease 23 kD IgG: NONREACTIVE
Lyme Disease 23 kD IgM: NONREACTIVE
Lyme Disease 28 kD IgG: NONREACTIVE
Lyme Disease 30 kD IgG: NONREACTIVE
Lyme Disease 39 kD IgG: NONREACTIVE
Lyme Disease 39 kD IgM: NONREACTIVE
Lyme Disease 41 kD IgG: REACTIVE — AB
Lyme Disease 41 kD IgM: NONREACTIVE
Lyme Disease 45 kD IgG: NONREACTIVE
Lyme Disease 58 kD IgG: NONREACTIVE
Lyme Disease 66 kD IgG: NONREACTIVE
Lyme Disease 93 kD IgG: REACTIVE — AB

## 2020-03-03 LAB — CBC WITH DIFFERENTIAL/PLATELET
Absolute Monocytes: 574 cells/uL (ref 200–950)
Basophils Absolute: 28 cells/uL (ref 0–200)
Basophils Relative: 0.4 %
Eosinophils Absolute: 77 cells/uL (ref 15–500)
Eosinophils Relative: 1.1 %
HCT: 43.3 % (ref 38.5–50.0)
Hemoglobin: 14.7 g/dL (ref 13.2–17.1)
Lymphs Abs: 1939 cells/uL (ref 850–3900)
MCH: 29.2 pg (ref 27.0–33.0)
MCHC: 33.9 g/dL (ref 32.0–36.0)
MCV: 86.1 fL (ref 80.0–100.0)
MPV: 10.3 fL (ref 7.5–12.5)
Monocytes Relative: 8.2 %
Neutro Abs: 4382 cells/uL (ref 1500–7800)
Neutrophils Relative %: 62.6 %
Platelets: 275 10*3/uL (ref 140–400)
RBC: 5.03 10*6/uL (ref 4.20–5.80)
RDW: 13.2 % (ref 11.0–15.0)
Total Lymphocyte: 27.7 %
WBC: 7 10*3/uL (ref 3.8–10.8)

## 2020-03-03 LAB — RHEUMATOID FACTOR: Rhuematoid fact SerPl-aCnc: <14 IU/mL (ref <14)

## 2020-03-03 LAB — CK: Total CK: 101 U/L (ref 44–196)

## 2020-03-03 LAB — ANA: Anti Nuclear Antibody (ANA): NEGATIVE

## 2020-03-03 LAB — SEDIMENTATION RATE: Sed Rate: 2 mm/h (ref 0–15)

## 2020-03-22 ENCOUNTER — Encounter: Payer: Self-pay | Admitting: Family Medicine

## 2020-03-22 ENCOUNTER — Other Ambulatory Visit: Payer: Self-pay | Admitting: *Deleted

## 2020-03-22 DIAGNOSIS — M791 Myalgia, unspecified site: Secondary | ICD-10-CM

## 2020-03-22 DIAGNOSIS — M2550 Pain in unspecified joint: Secondary | ICD-10-CM

## 2020-03-22 DIAGNOSIS — M797 Fibromyalgia: Secondary | ICD-10-CM

## 2020-03-27 ENCOUNTER — Encounter: Payer: Self-pay | Admitting: Family Medicine

## 2020-03-28 ENCOUNTER — Other Ambulatory Visit: Payer: Self-pay | Admitting: *Deleted

## 2020-03-28 DIAGNOSIS — Z136 Encounter for screening for cardiovascular disorders: Secondary | ICD-10-CM

## 2020-03-28 DIAGNOSIS — Z13220 Encounter for screening for lipoid disorders: Secondary | ICD-10-CM

## 2020-03-28 DIAGNOSIS — I10 Essential (primary) hypertension: Secondary | ICD-10-CM

## 2020-03-29 ENCOUNTER — Other Ambulatory Visit: Payer: Self-pay | Admitting: Family Medicine

## 2020-04-03 ENCOUNTER — Ambulatory Visit (INDEPENDENT_AMBULATORY_CARE_PROVIDER_SITE_OTHER): Payer: BC Managed Care – PPO | Admitting: Family Medicine

## 2020-04-03 ENCOUNTER — Other Ambulatory Visit: Payer: Self-pay

## 2020-04-03 VITALS — BP 116/66 | HR 60 | Resp 16 | Ht 70.0 in | Wt 237.0 lb

## 2020-04-03 DIAGNOSIS — I10 Essential (primary) hypertension: Secondary | ICD-10-CM

## 2020-04-03 DIAGNOSIS — M797 Fibromyalgia: Secondary | ICD-10-CM | POA: Diagnosis not present

## 2020-04-03 DIAGNOSIS — Z0001 Encounter for general adult medical examination with abnormal findings: Secondary | ICD-10-CM

## 2020-04-03 DIAGNOSIS — E669 Obesity, unspecified: Secondary | ICD-10-CM

## 2020-04-03 DIAGNOSIS — Z0000 Encounter for general adult medical examination without abnormal findings: Secondary | ICD-10-CM

## 2020-04-03 DIAGNOSIS — E7800 Pure hypercholesterolemia, unspecified: Secondary | ICD-10-CM | POA: Diagnosis not present

## 2020-04-03 NOTE — Progress Notes (Signed)
Subjective:    Patient ID: Stephen Allen, male    DOB: 1976/04/22, 44 y.o.   MRN: [id number]  Arthritis    11/2017 Symptoms began last night with a burning painful sensation in the back of his throat and in the back of his "sinuses."  He reports postnasal drip.  He states that it is draining down his throat causing him to cough.  The cough is productive of some yellowish sputum yesterday and into this morning.  Cough kept him awake all last night.  He denies any fever.  He denies any wheezing.  He denies any chest pain.  He denies any severe shortness of breath.  He denies any hemoptysis.  He denies any sinus pain.  The sore throat is better this morning.  He denies any significant rhinorrhea although he does report postnasal drip.  At that time, my plan was: Patient's history and exam is more consistent with a viral upper respiratory infection versus allergies.  I will treat the patient is an upper respiratory infection with decongestants.  Use Xyzal 5 mg p.o. daily as well as Flonase 2 sprays each nostril daily.  Use Hycodan 1 teaspoon every 6 hours as needed for cough.  Recommended waiting 5 to 6 days and I anticipate symptoms will slowly resolve on their own over that timeframe.  If symptoms worsen such that he develops high fever, purulent sputum, etc. recheck immediately.  Patient also then discussed chronic body pain all over his body.  He has been seeing orthopedist since age 57.  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  He has seen a rheumatologist in the past who told him he had osteoarthritis.  However his pain is out of proportion to what one would expect at his age and with osteoarthritis.  I suspect fibromyalgia.  Spent 10 minutes discussing this with him.  In the future we could try Lyrica if he would like to see if it would help mitigate some of his pain.  08/06/2018 Patient never tried the Lyrica.  He is here today to discuss options for pain  control.  He states that he has been dealing with arthritis since he was 13.  He is seen numerous orthopedist at [facility] and has been diagnosed with osteoarthritis in his hips as well as in his knees.  He had shoulder surgery on his left shoulder.  He deals with chronic pain in both shoulders left greater than right, both hips, and both knees.  He also has a history of chronic low back pain.  He states that his back frequently when out on him when he was younger.  He would take Flexeril and hydrocodone as needed for back pain and back spasms.  He has not taken any of that medication in last 3 years however he is using ibuprofen 2-3 times every day.  Patient states that he lives in constant pain.  The pain involves his lower back, his thighs, his knees, even his shins hurt.  He also has pain in both shoulders as well as in his hands.  He denies any fevers chills or rashes.  He was even sent to see a rheumatologist however he does not remember any blood panel that was drawn to evaluate for autoimmune diseases.  At that time, my plan was: Spent 15 to 20 minutes today with the patient discussing his past history.  I am concerned about the level of arthritis he has in such a  relatively young age.  He was extremely active when he was younger.  He competed in track, was a Camera operator, was a Event organiser.  Therefore is possible that repetitive trauma could have expedited his osteoarthritis and then his weight is only exacerbated the problem.  However him also concerned there may be an underlying autoimmune illness or possibly even an element of fibromyalgia.  Therefore we will try empirically Lyrica 75 mg p.o. twice daily and see if this helps his pain to avoid habit-forming medications.  I will also evaluate for autoimmune disease with a sedimentation rate, CRP, rheumatoid factor, and an ANA.  If lab work is negative and Lyrica shows no benefit, consider trying Cymbalta versus trying  tramadol.  02/01/19 Patient is here today for follow-up.  He has seen definite benefit since starting Lyrica 75 mg twice daily.  He denies any sedation.  He denies any dizziness.  He continues to suffer pain all over his body.  Autoimmune work-up was negative.  He still takes meloxicam daily for joint pain.  He continues to report pain all over his body.  He is interested in possibly increasing the Lyrica to achieve better pain control and improve quality of life.  We discussed the risk of gastrointestinal bleeding as well as possible renal damage due to NSAID overuse.  Gastrointestinal side effects.  His blood pressure today is elevated.  He states that he gets similar numbers at home.  Typically his systolic blood pressure is higher than 140 and his diastolic blood pressure is higher than 90.  He denies any chest pain shortness of breath or dyspnea on exertion.  02/29/20 Patient returns today complaining of pain all over his body.  He states that he has a migratory pain that seems to change on a day-to-day basis.  Sometimes he will hurt in his hips.  Sometimes he will hurt in his thighs.  Sometimes he will hurt in his calves.  At other times he will hurt in his shoulders.  The pain seems to move to different areas on a daily basis.  He reports moderate to severe pain all over his body.  There is no specific one location that hurts the worst.  Previously I worked the patient up for autoimmune diseases with normal lab work.  Initially he saw benefit from Lyrica however patient denies any help with pain at the present time.  He denies any rashes.  He denies any shortness of breath although he has been getting some substernal chest pressure.  This occurs after eating usually within an hour.  He will feel a burning pressure-like sensation near the xiphoid process.  He is taking meloxicam on a daily basis due to pain.  He denies any melena or hematochezia.  04/03/20 Patient is a very pleasant 44 year old gentleman  here today for his complete physical exam.  He is not yet due for colon cancer screening or prostate cancer screening.  He is due for a flu shot.  He declines a flu shot today.  He is also due for Covid vaccination.  Patient is hesitant to receive the Covid vaccine.  I strongly encouraged him to receive the Covid vaccine.  I explained to him I feel is only a matter of time before he requires the infection.  We discussed the risk of hospitalization and mortality.  Patient seems to be in the precontemplative phase.  Otherwise he is doing well.  He denies any chest pain shortness of breath or dyspnea on exertion.  He  continues to suffer from daily aches and pains in multiple joints all over his body.  The lab work we did in August showed a normal CBC, normal CMP, and no evidence of an autoimmune disease.  Past Medical History:  Diagnosis Date  . Arthritis    [employer] 04/03/2020  . Asthma    Dx'd age 13  . Asthma    [employer] 04/03/2020  . Migraines   . Obesity    Past Surgical History:  Procedure Laterality Date  . HEMANGIOMA EXCISION  04/2006  . SHOULDER SURGERY Left 2006   Current Outpatient Medications on File Prior to Visit  Medication Sig Dispense Refill  . ALPRAZolam (XANAX) 0.5 MG tablet Take 1 tablet (0.5 mg total) by mouth 3 (three) times daily as needed for anxiety. 30 tablet 0  . Glucos-Chondroit-Hyaluron-MSM (GLUCOSAMINE CHONDROITIN JOINT PO) Take by mouth.    . hydrochlorothiazide (HYDRODIURIL) 25 MG tablet Take 1 tablet (25 mg total) by mouth daily. 90 tablet 3  . HYDROcodone-acetaminophen (NORCO/VICODIN) 5-325 MG tablet Take 1 tablet by mouth every 12 (twelve) hours as needed for severe pain. 10 tablet 0  . meloxicam (MOBIC) 15 MG tablet TAKE 1 TABLET BY MOUTH ONCE DAILY WITH A MEAL. 30 tablet 0  . metoprolol succinate (TOPROL-XL) 50 MG 24 hr tablet TAKE (1) TABLET BY MOUTH ONCE DAILY. 90 tablet 0  . pregabalin (LYRICA) 100 MG capsule TAKE (1) CAPSULE BY MOUTH TWICE DAILY. 60  capsule 0  . PROAIR HFA 108 (90 Base) MCG/ACT inhaler USE 2 PUFFS EVERY 4 HOURS AS NEEDED. 8.5 g 0   No current facility-administered medications on file prior to visit.   Allergies  Allergen Reactions  . Cortisone Other (See Comments)    Hx of retinal detachment  . Other   . Albuterol     Migraine   . Ventolin [Kdc:Albuterol]     Migraines   Social History   Socioeconomic History  . Marital status: Married    Spouse name: Not on file  . Number of children: Not on file  . Years of education: Not on file  . Highest education level: Not on file  Occupational History  . Not on file  Tobacco Use  . Smoking status: Never Smoker  . Smokeless tobacco: Never Used  Vaping Use  . Vaping Use: Never used  Substance and Sexual Activity  . Alcohol use: Yes    Alcohol/week: 0.0 standard drinks  . Drug use: No  . Sexual activity: Not on file  Other Topics Concern  . Not on file  Social History Narrative  . Not on file   Social Determinants of Health   Financial Resource Strain:   . Difficulty of Paying Living Expenses: Not on file  Food Insecurity:   . Worried About Programme researcher, broadcasting/film/video in the Last Year: Not on file  . Ran Out of Food in the Last Year: Not on file  Transportation Needs:   . Lack of Transportation (Medical): Not on file  . Lack of Transportation (Non-Medical): Not on file  Physical Activity:   . Days of Exercise per Week: Not on file  . Minutes of Exercise per Session: Not on file  Stress:   . Feeling of Stress : Not on file  Social Connections:   . Frequency of Communication with Friends and Family: Not on file  . Frequency of Social Gatherings with Friends and Family: Not on file  . Attends Religious Services: Not on file  . Active Member  of Clubs or Organizations: Not on file  . Attends Banker Meetings: Not on file  . Marital Status: Not on file  Intimate Partner Violence:   . Fear of Current or Ex-Partner: Not on file  . Emotionally  Abused: Not on file  . Physically Abused: Not on file  . Sexually Abused: Not on file     Review of Systems  Musculoskeletal: Positive for arthritis.  All other systems reviewed and are negative.      Objective:   Physical Exam Vitals reviewed.  Constitutional:      General: He is not in acute distress.    Appearance: Normal appearance. He is well-developed. He is obese. He is not ill-appearing, toxic-appearing or diaphoretic.  HENT:     Head: Normocephalic and atraumatic.     Right Ear: Tympanic membrane, ear canal and external ear normal. There is no impacted cerumen.     Left Ear: Tympanic membrane, ear canal and external ear normal. There is no impacted cerumen.     Nose: Nose normal. No congestion or rhinorrhea.     Mouth/Throat:     Mouth: Mucous membranes are moist.     Pharynx: No oropharyngeal exudate or posterior oropharyngeal erythema.  Eyes:     Extraocular Movements: Extraocular movements intact.     Conjunctiva/sclera: Conjunctivae normal.     Pupils: Pupils are equal, round, and reactive to light.  Neck:     Vascular: No carotid bruit.  Cardiovascular:     Rate and Rhythm: Normal rate and regular rhythm.     Heart sounds: Normal heart sounds. No murmur heard.  No friction rub. No gallop.   Pulmonary:     Effort: Pulmonary effort is normal. No respiratory distress.     Breath sounds: Normal breath sounds. No stridor. No wheezing, rhonchi or rales.  Chest:     Chest wall: No tenderness.  Abdominal:     General: Abdomen is flat. Bowel sounds are normal. There is no distension.     Palpations: Abdomen is soft. There is no mass.     Tenderness: There is no abdominal tenderness. There is no right CVA tenderness, guarding or rebound.     Hernia: No hernia is present.  Musculoskeletal:     Cervical back: Normal range of motion and neck supple. No rigidity or tenderness.     Right lower leg: No edema.  Lymphadenopathy:     Cervical: No cervical adenopathy.   Neurological:     General: No focal deficit present.     Mental Status: He is alert and oriented to person, place, and time. Mental status is at baseline.     Cranial Nerves: No cranial nerve deficit.     Sensory: No sensory deficit.     Motor: No weakness.     Coordination: Coordination normal.     Gait: Gait normal.     Deep Tendon Reflexes: Reflexes normal.           Assessment & Plan:  Pure hypercholesterolemia - Plan: Lipid panel  Benign essential HTN  Fibromyalgia  Obesity (BMI 30-39.9)  General medical exam  Office Visit on 02/29/2020  Component Date Value Ref Range Status  . WBC 02/29/2020 7.0  3.8 - 10.8 Thousand/uL Final  . RBC 02/29/2020 5.03  4.20 - 5.80 Million/uL Final  . Hemoglobin 02/29/2020 14.7  13.2 - 17.1 g/dL Final  . HCT 56/21/3086 43.3  38 - 50 % Final  . MCV 02/29/2020 86.1  80.0 -  100.0 fL Final  . MCH 02/29/2020 29.2  27.0 - 33.0 pg Final  . MCHC 02/29/2020 33.9  32.0 - 36.0 g/dL Final  . RDW 16/10/960408/04/2020 13.2  11.0 - 15.0 % Final  . Platelets 02/29/2020 275  140 - 400 Thousand/uL Final  . MPV 02/29/2020 10.3  7.5 - 12.5 fL Final  . Neutro Abs 02/29/2020 4,382  1,500 - 7,800 cells/uL Final  . Lymphs Abs 02/29/2020 1,939  850 - 3,900 cells/uL Final  . Absolute Monocytes 02/29/2020 574  200 - 950 cells/uL Final  . Eosinophils Absolute 02/29/2020 77  15 - 500 cells/uL Final  . Basophils Absolute 02/29/2020 28  0 - 200 cells/uL Final  . Neutrophils Relative % 02/29/2020 62.6  % Final  . Total Lymphocyte 02/29/2020 27.7  % Final  . Monocytes Relative 02/29/2020 8.2  % Final  . Eosinophils Relative 02/29/2020 1.1  % Final  . Basophils Relative 02/29/2020 0.4  % Final  . Glucose, Bld 02/29/2020 91  65 - 99 mg/dL Final   Comment: .            Fasting reference interval .   . BUN 02/29/2020 12  7 - 25 mg/dL Final  . Creat 54/09/811908/04/2020 0.97  0.60 - 1.35 mg/dL Final  . GFR, Est Non African American 02/29/2020 95  > OR = 60 mL/min/1.3473m2 Final  .  GFR, Est African American 02/29/2020 110  > OR = 60 mL/min/1.8973m2 Final  . BUN/Creatinine Ratio 02/29/2020 NOT APPLICABLE  6 - 22 (calc) Final  . Sodium 02/29/2020 139  135 - 146 mmol/L Final  . Potassium 02/29/2020 4.0  3.5 - 5.3 mmol/L Final  . Chloride 02/29/2020 102  98 - 110 mmol/L Final  . CO2 02/29/2020 27  20 - 32 mmol/L Final  . Calcium 02/29/2020 9.4  8.6 - 10.3 mg/dL Final  . Total Protein 02/29/2020 6.8  6.1 - 8.1 g/dL Final  . Albumin 14/78/295608/04/2020 4.5  3.6 - 5.1 g/dL Final  . Globulin 21/30/865708/04/2020 2.3  1.9 - 3.7 g/dL (calc) Final  . AG Ratio 02/29/2020 2.0  1.0 - 2.5 (calc) Final  . Total Bilirubin 02/29/2020 0.6  0.2 - 1.2 mg/dL Final  . Alkaline phosphatase (APISO) 02/29/2020 68  36 - 130 U/L Final  . AST 02/29/2020 14  10 - 40 U/L Final  . ALT 02/29/2020 16  9 - 46 U/L Final  . Total CK 02/29/2020 101  44.0 - 196.0 U/L Final  . Rhuematoid fact SerPl-aCnc 02/29/2020 <14  <14 IU/mL Final  . Anti Nuclear Antibody (ANA) 02/29/2020 NEGATIVE  NEGATIVE Final   Comment: ANA IFA is a first line screen for detecting the presence of up to approximately 150 autoantibodies in various autoimmune diseases. A negative ANA IFA result suggests an ANA-associated autoimmune disease is not present at this time, but is not definitive. If there is high clinical suspicion for Sjogren's syndrome, testing for anti-SS-A/Ro antibody should be considered. Anti-Jo-1 antibody should be considered for clinically suspected inflammatory myopathies. . AC-0: Negative . International Consensus on ANA Patterns (SeverTies.uyhttps://doi.org/10.1515/cclm-2018-0052) . For additional information, please refer to http://education.[facility].com/faq/FAQ177 (This link is being provided for informational/ educational purposes only.) .   Marland Kitchen. Sed Rate 02/29/2020 2  0 - 15 mm/h Final  . B burgdorferi IgG Abs (IB) 02/29/2020 NEGATIVE  NEGATIVE Final  . Lyme Disease 18 kD IgG 02/29/2020 NON-REACTIVE   Final  . Lyme  Disease 23 kD IgG 02/29/2020 NON-REACTIVE   Final  . Lyme Disease 28  kD IgG 02/29/2020 NON-REACTIVE   Final  . Lyme Disease 30 kD IgG 02/29/2020 NON-REACTIVE   Final  . Lyme Disease 39 kD IgG 02/29/2020 NON-REACTIVE   Final  . Lyme Disease 41 kD IgG 02/29/2020 REACTIVE*  Final  . Lyme Disease 45 kD IgG 02/29/2020 NON-REACTIVE   Final  . Lyme Disease 58 kD IgG 02/29/2020 NON-REACTIVE   Final  . Lyme Disease 66 kD IgG 02/29/2020 NON-REACTIVE   Final  . Lyme Disease 93 kD IgG 02/29/2020 REACTIVE*  Final  . B burgdorferi IgM Abs (IB) 02/29/2020 NEGATIVE  NEGATIVE Final  . Lyme Disease 23 kD IgM 02/29/2020 NON-REACTIVE   Final  . Lyme Disease 39 kD IgM 02/29/2020 NON-REACTIVE   Final  . Lyme Disease 41 kD IgM 02/29/2020 NON-REACTIVE   Final   Comment: As per CDC criteria, a Lyme disease IgG Immunoblot must show reactivity to at least 5 of 10 specific borrelial proteins to be considered positive; similarly, a  positive Lyme disease IgM immunoblot requires reactivity to 2 of 3 specific borrelial proteins. Although considered negative, IgG reactivity to fewer specific borrelial proteins or IgM reactivity to only 1 protein may indicate recent B. burgdorferi infection and warrant testing of a later sample. A positive IgM but negative IgG result obtained more than a month after onset of symptoms likely represents a false- positive IgM result rather than acute Lyme disease. In rare instances, Lyme disease immunoblot reactivity may represent antibodies induced by exposure to other spirochetes.  . Lyme immunoblot testing should only be performed on samples from patients who have had a Positive or Equivocal result in a screening assay.    I will check a fasting lipid panel today.  The remainder of his lab work checked in August was normal.  I strongly recommended the flu shot.  Also strongly recommended the Covid vaccination.  The patient politely declines both.  He is not yet due for colon cancer  screening.  He is not yet due for prostate cancer screening.  The remainder of his physical exam is normal.  His blood pressure so good that we will discontinue hydrochlorothiazide and simply monitor his blood pressure

## 2020-04-04 LAB — LIPID PANEL
Cholesterol: 190 mg/dL (ref <200)
HDL: 38 mg/dL — ABNORMAL LOW (ref >=40)
LDL Cholesterol (Calc): 114 mg/dL (calc) — ABNORMAL HIGH
Non-HDL Cholesterol (Calc): 152 mg/dL (calc) — ABNORMAL HIGH (ref <130)
Total CHOL/HDL Ratio: 5 (calc) — ABNORMAL HIGH (ref <5.0)
Triglycerides: 269 mg/dL — ABNORMAL HIGH (ref <150)

## 2020-04-25 ENCOUNTER — Other Ambulatory Visit: Payer: Self-pay | Admitting: Family Medicine

## 2020-04-25 NOTE — Telephone Encounter (Signed)
Ok to refill??  Last office visit 04/03/2020.  Last refill 03/30/2020.

## 2020-04-28 NOTE — Progress Notes (Signed)
Office Visit Note  Patient: Stephen Allen             Date of Birth: Oct 18, 1975           MRN: [id number]             PCP: Susy Frizzle, MD Referring: Susy Frizzle, MD Visit Date: 05/01/2020 Occupation: [employer] Professor  Subjective:  Chief Complaint: Joint and muscle pain in back and legs  History of Present Illness: Stephen Allen is a 44 y.o. male here for evaluation of myalgias and joint pains.  His back and leg pain goes back around 20 years or more.  He does not recall precipitating major injury or any surgeries at the sites.  He generally does not notice swelling although occasionally present in his legs not within the past year.  He does not experience symptoms of numbness or weakness in his lower extremities.  The pain is worse with prolonged standing.  The pain does not usually wake him up at night.  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 He has been using over-the-counter NSAIDs for many years until more recently he switched to meloxicam by his physicians.  He has had numerous corticosteroid injection into the knees however suffered a retinal detachment few years ago after which additional steroid injections were advised against by his ophthalmologist.  He has had some NSAID injections since that time that have also provided a period of benefit.  His father suffered severe osteoarthritis with numerous joint replacements but denies family history of inflammatory arthritis.  Reviewed labs also include the following ANA negative ESR 2 RF negative CK 101   Activities of Daily Living:  Patient reports morning stiffness for 1 hour.   Patient Reports nocturnal pain.  Difficulty dressing/grooming: Reports Difficulty climbing stairs: Reports Difficulty getting out of chair: Reports Difficulty using hands for taps, buttons, cutlery, and/or writing: Denies  Review of Systems  Constitutional: Positive for  fatigue.  HENT: Negative for mouth sores, mouth dryness and nose dryness.   Eyes: Negative for pain, itching, visual disturbance and dryness.  Respiratory: Negative for cough, hemoptysis, shortness of breath and difficulty breathing.   Cardiovascular: Negative for chest pain, palpitations and swelling in legs/feet.  Gastrointestinal: Negative for abdominal pain, blood in stool, constipation and diarrhea.  Endocrine: Negative for increased urination.  Genitourinary: Negative for painful urination.  Musculoskeletal: Positive for arthralgias, joint pain, joint swelling, myalgias, muscle weakness, morning stiffness, muscle tenderness and myalgias.  Skin: Negative for color change, rash and redness.  Allergic/Immunologic: Negative for susceptible to infections.  Neurological: Positive for memory loss and weakness. Negative for dizziness and headaches.  Hematological: Negative for swollen glands.  Psychiatric/Behavioral: Positive for sleep disturbance. Negative for depressed mood and confusion. The patient is nervous/anxious.     PMFS History:  Patient Active Problem List   Diagnosis Date Noted  . Myalgia 05/01/2020  . Bilateral primary osteoarthritis of knee 05/01/2020  . Closed fracture of phalanx of finger 08/19/2019  . Retinal disorder 10/23/2018  . Obesity (BMI 30-39.9) 01/15/2017  . Asthma 10/18/2010  . Migraines 10/18/2010    Past Medical History:  Diagnosis Date  . Arthritis    Phreesia 04/03/2020  . Asthma    Dx'd age 23  . Asthma    Phreesia 04/03/2020  . Migraines   . Obesity     Family History  Problem Relation Age of Onset  . Hypertension Father   .  Asthma Brother   . Diabetes Brother   . Cancer Paternal Grandmother        Breast   Past Surgical History:  Procedure Laterality Date  . HEMANGIOMA EXCISION  04/2006  . SHOULDER SURGERY Left 2006   Social History   Social History Narrative  . Not on file   Immunization History  Administered Date(s)  Administered  . Tdap 03/03/2007     Objective: Vital Signs: BP 131/65 (BP Location: Right Arm, Patient Position: Sitting, Cuff Size: Small)   Pulse 62   Resp 14   Ht 5' 9.5" (1.765 m)   Wt 241 lb (109.3 kg)   BMI 35.08 kg/m    Physical Exam HENT:     Right Ear: External ear normal.     Left Ear: External ear normal.  Cardiovascular:     Rate and Rhythm: Normal rate and regular rhythm.  Skin:    General: Skin is warm and dry.  Neurological:     General: No focal deficit present.      Musculoskeletal Exam:  Neck full range of motion no tenderness Shoulder, elbow, wrist, fingers full range of motion no tenderness or swelling No paraspinal tenderness to palpation over upper back, lower back paraspinal muscle tenderness bilaterally Normal hip internal and external rotation without pain, mild tenderness to lateral hip palpation bilaterally Knees bilateral patellofemoral crepitus present with preserved range of motion and no swelling Ankles, MTPs full range of motion no tenderness or swelling   Investigation: No additional findings.  Imaging: No results found.  Recent Labs: Lab Results  Component Value Date   WBC 7.0 02/29/2020   HGB 14.7 02/29/2020   PLT 275 02/29/2020   NA 139 02/29/2020   K 4.0 02/29/2020   CL 102 02/29/2020   CO2 27 02/29/2020   GLUCOSE 91 02/29/2020   BUN 12 02/29/2020   CREATININE 0.97 02/29/2020   BILITOT 0.6 02/29/2020   ALKPHOS 80 03/28/2017   AST 14 02/29/2020   ALT 16 02/29/2020   PROT 6.8 02/29/2020   ALBUMIN 4.1 03/28/2017   CALCIUM 9.4 02/29/2020   GFRAA 110 02/29/2020    Speciality Comments: No specialty comments available.  Procedures:  No procedures performed Allergies: Cortisone, Other, Albuterol, and Ventolin [kdc:albuterol]   Assessment / Plan:     Visit Diagnoses: Myalgia - Plan: Iron, TIBC and Ferritin Panel Stephen Allen's muscle aches are not associated with any loss of strength and had normal enzymes on reviewed  lab tests.  Suspect muscular pain is either referred from joint osteoarthritis or related to abnormal biomechanics on account of this.  Low suspicion for inflammatory disease.  He was not interested in referral to physical therapy at this time as he is completed several sessions with them in the past but did provide some relevant exercise instructions to recommend trying at home.  Bilateral primary osteoarthritis of knee - Plan: Iron, TIBC and Ferritin Panel  He has very significant osteoarthritis of multiple joints especially the bilateral knees without any profession or event history to explain these being advanced for age.  Does have a strong family history so could just be primary osteoarthritis.  Labs reviewed include a work-up not suggestive of inflammatory arthritis and the images are more consistent with degenerative disease.  We will add iron studies today as well screening for hemochromatosis that can be associated with accelerated osteoarthritis of multiple sites.  He is already on NSAIDs and steroid injections contraindicated per ophthalmology. Overall low suspicion for inflammatory disease from  history and exam and if findings are negative would recommend he follow-up in orthopedics office for ongoing treatment.  Orders: Orders Placed This Encounter  Procedures  . Iron, TIBC and Ferritin Panel   No orders of the defined types were placed in this encounter.   Follow-Up Instructions: No follow-ups on file.   Collier Salina, MD  Note - This record has been created using [employer].  Chart creation errors have been sought, but may not always  have been located. Such creation errors do not reflect on  the standard of medical care.

## 2020-05-01 ENCOUNTER — Other Ambulatory Visit: Payer: Self-pay

## 2020-05-01 ENCOUNTER — Encounter: Payer: Self-pay | Admitting: Internal Medicine

## 2020-05-01 ENCOUNTER — Ambulatory Visit (INDEPENDENT_AMBULATORY_CARE_PROVIDER_SITE_OTHER): Payer: BC Managed Care – PPO | Admitting: Internal Medicine

## 2020-05-01 VITALS — BP 131/65 | HR 62 | Resp 14 | Ht 69.5 in | Wt 241.0 lb

## 2020-05-01 DIAGNOSIS — M791 Myalgia, unspecified site: Secondary | ICD-10-CM | POA: Diagnosis not present

## 2020-05-01 DIAGNOSIS — M170 Bilateral primary osteoarthritis of knee: Secondary | ICD-10-CM

## 2020-05-01 DIAGNOSIS — M7918 Myalgia, other site: Secondary | ICD-10-CM | POA: Insufficient documentation

## 2020-05-01 NOTE — Patient Instructions (Signed)
Quadriceps Ask your health care provider which exercises are safe for you. Do exercises exactly as told by your health care provider and adjust them as directed. It is normal to feel mild stretching, pulling, tightness, or discomfort as you do these exercises. Stop right away if you feel sudden pain or your pain gets worse. Stretching and range-of-motion exercises These exercises warm up your muscles and joints and improve the movement and flexibility of your thigh. These exercises can also help to relieve stiffness or swelling. Quadriceps stretch, prone  1. Lie on your abdomen on a firm surface, such as a bed or padded floor (prone position). 2. Bend your left / right knee and hold your ankle. If you cannot reach your ankle or pant leg, loop a belt around your foot and grab the belt instead. 3. Gently pull your heel toward your buttocks. Your knee should not slide out to the side. You should feel a stretch in the front of your thigh and knee (quadriceps). 4. Hold this position for 10 seconds. Repeat 4 times. Complete this exercise daily  Strengthening exercises These exercises build strength and endurance in your thigh. Endurance is the ability to use your muscles for a long time, even after your muscles get tired. Straight leg raises, supine This exercise stretches the muscles in front of your thigh (quadriceps) and the muscles that move your hips (hip flexors). Quality counts! Watch for signs that the quadriceps muscle is working to ensure that you are strengthening the correct muscles and not cheating by using healthier muscles. 1. Lie on your back (supine position) with your left / right leg extended and your other knee bent. 2. Tense the muscles in the front of your left / right thigh. You should see your kneecap slide up or see increased dimpling just above the knee. 3. Tighten these muscles even more and raise your leg 4-6 inches (10-15 cm) off the floor. 4. Hold this position for 2  seconds. 5. Keep the thigh muscles tense as you lower your leg. 6. Relax the muscles slowly and completely after each repetition. Repeat 10 times. Complete this exercise daily Leg raises, prone This exercise strengthens the muscles that move the hips (hip extensors). 1. Lie on your abdomen on a bed or a firm surface (prone position). Place a pillow under your hips. 2. Bend your left / right knee so your foot is straight up in the air. 3. Squeeze your buttocks muscles and lift your left / right thigh off the bed. Do not let your back arch. 4. Hold this position for 2 seconds. 5. Slowly return to the starting position. Let your muscles relax completely before doing another repetition. Repeat 10 times. Complete this exercise daily Wall sits Follow the directions for form closely. Knee pain can occur if your feet or knees are not placed properly. 1. Lean your back against a smooth wall or door, and walk your feet out 18-24 inches (46-61 cm) from it. 2. Place your feet hip-width apart. 3. Slowly slide down the wall or door until your knees bend up to 90 degrees. Keep your weight back and over your heels, not over your toes. Keep your thighs straight or pointing slightly outward. 4. Hold this position for 2 seconds. 5. Use your thigh and buttocks muscles to push yourself back up to a standing position. Keep your weight through your heels while you do this. Repeat 10 times. Complete this exercise daily This information is not intended to replace advice given  to you by your health care provider. Make sure you discuss any questions you have with your health care provider. Document Revised: 10/30/2018 Document Reviewed: 04/30/2018 Elsevier Patient Education  2020 [employer].

## 2020-05-02 LAB — IRON,TIBC AND FERRITIN PANEL
%SAT: 24 % (calc) (ref 20–48)
Ferritin: 140 ng/mL (ref 38–380)
Iron: 95 ug/dL (ref 50–180)
TIBC: 403 mcg/dL (calc) (ref 250–425)

## 2020-05-23 ENCOUNTER — Ambulatory Visit: Payer: BC Managed Care – PPO | Admitting: Internal Medicine

## 2020-05-31 ENCOUNTER — Other Ambulatory Visit: Payer: Self-pay | Admitting: Family Medicine

## 2020-05-31 NOTE — Telephone Encounter (Signed)
Please Advise

## 2020-06-29 ENCOUNTER — Other Ambulatory Visit: Payer: Self-pay | Admitting: Family Medicine

## 2020-06-29 NOTE — Telephone Encounter (Signed)
Ok to refill??  Last office visit 04/03/2020.  Last refill 06/01/2020.  Ok to add refills to Lyrica prescription?

## 2020-07-26 ENCOUNTER — Other Ambulatory Visit: Payer: Self-pay | Admitting: Family Medicine

## 2020-08-12 ENCOUNTER — Encounter: Payer: Self-pay | Admitting: Family Medicine

## 2020-08-14 ENCOUNTER — Telehealth: Payer: Self-pay | Admitting: *Deleted

## 2020-08-14 DIAGNOSIS — J45909 Unspecified asthma, uncomplicated: Secondary | ICD-10-CM

## 2020-08-14 DIAGNOSIS — I10 Essential (primary) hypertension: Secondary | ICD-10-CM

## 2020-08-14 DIAGNOSIS — E7800 Pure hypercholesterolemia, unspecified: Secondary | ICD-10-CM

## 2020-08-14 DIAGNOSIS — E669 Obesity, unspecified: Secondary | ICD-10-CM

## 2020-08-14 DIAGNOSIS — M797 Fibromyalgia: Secondary | ICD-10-CM

## 2020-08-14 NOTE — Telephone Encounter (Signed)
-----   Message from Donita Brooks, MD sent at 08/14/2020 10:58 AM EST ----- Please refer to [facility] infusion clinic. He is aware of the odds.

## 2020-08-15 ENCOUNTER — Other Ambulatory Visit: Payer: Self-pay | Admitting: Physician Assistant

## 2020-08-15 DIAGNOSIS — U071 COVID-19: Secondary | ICD-10-CM

## 2020-08-15 DIAGNOSIS — E669 Obesity, unspecified: Secondary | ICD-10-CM

## 2020-08-15 DIAGNOSIS — I10 Essential (primary) hypertension: Secondary | ICD-10-CM

## 2020-08-15 NOTE — Progress Notes (Signed)
I connected by phone with Stephen Allen on 08/15/2020 at 3:01 PM to discuss the potential use of a new treatment for mild to moderate COVID-19 viral infection in non-hospitalized patients.  This patient is a 45 y.o. male that meets the FDA criteria for Emergency Use Authorization of COVID monoclonal antibody sotrovimab.  Has a (+) direct SARS-CoV-2 viral test result  Has mild or moderate COVID-19   Is NOT hospitalized due to COVID-19  Is within 10 days of symptom onset  Has at least one of the high risk factor(s) for progression to severe COVID-19 and/or hospitalization as defined in EUA.  Specific high risk criteria : BMI > 25 and Cardiovascular disease or hypertension   I have spoken and communicated the following to the patient or parent/caregiver regarding COVID monoclonal antibody treatment:  1. FDA has authorized the emergency use for the treatment of mild to moderate COVID-19 in adults and pediatric patients with positive results of direct SARS-CoV-2 viral testing who are 53 years of age and older weighing at least 40 kg, and who are at high risk for progressing to severe COVID-19 and/or hospitalization.  2. The significant known and potential risks and benefits of COVID monoclonal antibody, and the extent to which such potential risks and benefits are unknown.  3. Information on available alternative treatments and the risks and benefits of those alternatives, including clinical trials.  4. Patients treated with COVID monoclonal antibody should continue to self-isolate and use infection control measures (e.g., wear mask, isolate, social distance, avoid sharing personal items, clean and disinfect "high touch" surfaces, and frequent handwashing) according to CDC guidelines.   5. The patient or parent/caregiver has the option to accept or refuse COVID monoclonal antibody treatment.  After reviewing this information with the patient, the patient has agreed to receive one of the  available covid 19 monoclonal antibodies and will be provided an appropriate fact sheet prior to infusion.    [place], [place] 08/15/2020 3:01 PM

## 2020-08-16 ENCOUNTER — Ambulatory Visit (HOSPITAL_COMMUNITY)
Admission: RE | Admit: 2020-08-16 | Discharge: 2020-08-16 | Disposition: A | Discharge status: Home / Self Care | Payer: BC Managed Care – PPO | Source: Ambulatory Visit | Attending: Pulmonary Disease | Admitting: Pulmonary Disease

## 2020-08-16 DIAGNOSIS — U071 COVID-19: Secondary | ICD-10-CM | POA: Diagnosis present

## 2020-08-16 MED ORDER — SOTROVIMAB 500 MG/8ML IV SOLN
500.0000 mg | Freq: Once | INTRAVENOUS | Status: AC
  Administered 2020-08-16: 500 mg via INTRAVENOUS

## 2020-08-16 MED ORDER — ALBUTEROL SULFATE HFA 108 (90 BASE) MCG/ACT IN AERS: 2.0000 | INHALATION_SPRAY | Freq: Once | RESPIRATORY_TRACT | Status: DC | PRN

## 2020-08-16 MED ORDER — EPINEPHRINE 0.3 MG/0.3ML IJ SOAJ: 0.3000 mg | Freq: Once | INTRAMUSCULAR | Status: DC | PRN

## 2020-08-16 MED ORDER — DIPHENHYDRAMINE HCL 50 MG/ML IJ SOLN: 50.0000 mg | Freq: Once | INTRAMUSCULAR | Status: DC | PRN

## 2020-08-16 MED ORDER — FAMOTIDINE IN NACL 20-0.9 MG/50ML-% IV SOLN: 20.0000 mg | Freq: Once | INTRAVENOUS | Status: DC | PRN

## 2020-08-16 MED ORDER — SODIUM CHLORIDE 0.9 % IV SOLN: INTRAVENOUS | Status: DC | PRN

## 2020-08-16 MED ORDER — METHYLPREDNISOLONE SODIUM SUCC 125 MG IJ SOLR: 125.0000 mg | Freq: Once | INTRAMUSCULAR | Status: DC | PRN

## 2020-08-16 NOTE — Progress Notes (Signed)
Patient reviewed Fact Sheet for Patients, Parents, and Caregivers for Emergency Use Authorization (EUA) of sotrovimab for the Treatment of Coronavirus. Patient also reviewed and is agreeable to the estimated cost of treatment. Patient is agreeable to proceed.   

## 2020-08-16 NOTE — Progress Notes (Signed)
Diagnosis: COVID-19  Physician: Dr. Patrick Wright  Procedure: Cov[facility] Infusion Clinic Med: Sotrovimab infusion - Provided patient with sotrovimab fact sheet for patients, parents, and caregivers prior to infusion.   Complications: No immediate complications noted  Discharge: Discharged home    

## 2020-08-16 NOTE — Discharge Instructions (Signed)

## 2020-08-26 ENCOUNTER — Other Ambulatory Visit: Payer: Self-pay | Admitting: Family Medicine

## 2020-10-03 ENCOUNTER — Other Ambulatory Visit: Payer: Self-pay | Admitting: Family Medicine

## 2020-10-18 ENCOUNTER — Encounter: Payer: Self-pay | Admitting: Emergency Medicine

## 2020-10-18 ENCOUNTER — Telehealth: Payer: Self-pay | Admitting: *Deleted

## 2020-10-18 ENCOUNTER — Ambulatory Visit
Admission: EM | Admit: 2020-10-18 | Discharge: 2020-10-18 | Disposition: A | Discharge status: Home / Self Care | Payer: BC Managed Care – PPO | Attending: Family Medicine | Admitting: Family Medicine

## 2020-10-18 ENCOUNTER — Other Ambulatory Visit: Payer: Self-pay

## 2020-10-18 DIAGNOSIS — R519 Headache, unspecified: Secondary | ICD-10-CM

## 2020-10-18 DIAGNOSIS — I10 Essential (primary) hypertension: Secondary | ICD-10-CM

## 2020-10-18 MED ORDER — KETOROLAC TROMETHAMINE 60 MG/2ML IM SOLN
60.0000 mg | Freq: Once | INTRAMUSCULAR | Status: AC
  Administered 2020-10-18: 60 mg via INTRAMUSCULAR

## 2020-10-18 NOTE — Discharge Instructions (Addendum)
You have been seen at the [facility] today for chest pain. Your evaluation today was not suggestive of any emergent condition requiring medical intervention at this time. Your ECG (heart tracing) did not show any worrisome changes. However, some medical problems make take more time to appear. Therefore, it's very important that you pay attention to any new symptoms or worsening of your current condition.  Please proceed directly to the Emergency Department immediately should you feel worse in any way or have any of the following symptoms: increasing or different chest pain, pain that spreads to your arm, neck, jaw, back or abdomen, shortness of breath, or nausea and vomiting.  Meds ordered this encounter  Medications   ketorolac (TORADOL) injection 60 mg

## 2020-10-18 NOTE — ED Provider Notes (Signed)
[facility]   [id number] 10/18/20 Arrival Time: 1250  ASSESSMENT & PLAN:  1. Elevated blood pressure reading in office with diagnosis of hypertension   2. Acute nonintractable headache, unspecified headache type     Normal neurological exam. Afebrile without nuchal rigidity. Discussed. Current presentation and symptoms are consistent with prior headaches and are not consistent with SAH, ICH, meningitis, or temporal arteritis. No indication for neurodiagnostic workup at this time. Discussed.  ECG: NSR. No STEMI.   Discharge Instructions      You have been seen at the [facility] today for chest pain. Your evaluation today was not suggestive of any emergent condition requiring medical intervention at this time. Your ECG (heart tracing) did not show any worrisome changes. However, some medical problems make take more time to appear. Therefore, it's very important that you pay attention to any new symptoms or worsening of your current condition.  Please proceed directly to the Emergency Department immediately should you feel worse in any way or have any of the following symptoms: increasing or different chest pain, pain that spreads to your arm, neck, jaw, back or abdomen, shortness of breath, or nausea and vomiting.  Meds ordered this encounter  Medications  . ketorolac (TORADOL) injection 60 mg        Recommend:  Follow-up Information    Schedule an appointment as soon as possible for a visit  with Donita Brooks, MD.   Specialty: Family Medicine Why: To recheck your blood pressure. Contact information: [address] [place] [address] [phone]                Reviewed expectations re: course of current medical issues. Questions answered. Outlined signs and symptoms indicating need for more acute intervention. Patient verbalized understanding. After Visit Summary given.   SUBJECTIVE: History from: Patient Patient is able to  give a clear and coherent history.  Stephen Allen is a 45 y.o. male who presents with complaint of a dull headache. First noted a couple of hours ago. Related similar headaches with increase blood pressure. Takes Toprol prn. Did not measure BP today. Without visual changes, n/v. No extremity sensation changes or weakness. Also with "a tightness in my chest" that he reports having in the past with similar headaches. OTC analgesics with mild relief. Not the worst HA of his life.  OBJECTIVE:  Vitals:   10/18/20 1300  BP: (!) 167/99  Pulse: 72  Resp: 18  Temp: 97.6 F (36.4 C)  TempSrc: Oral  SpO2: 98%    General appearance: alert; NAD HENT: normocephalic; atraumatic Eyes: PERRLA; EOMI; conjunctivae normal Neck: supple with FROM Lungs: clear to auscultation bilaterally; unlabored respirations Heart: regular rate and rhythm Extremities: no edema; symmetrical with no gross deformities Skin: warm and dry Neurologic: alert; speech is fluent and clear without dysarthria or aphasia; CN 2-12 grossly intact; no facial droop; normal gait; normal extremity strength and sensation throughout    Allergies  Allergen Reactions  . Cortisone Other (See Comments)    Hx of retinal detachment  . Other   . Albuterol     Migraine   . Ventolin [Kdc:Albuterol]     Migraines    Past Medical History:  Diagnosis Date  . Arthritis    [employer]reesia 04/03/2020  . Asthma    Dx'd age 20  . Asthma    Phreesia 04/03/2020  . Migraines   . Obesity    Social History   Socioeconomic History  .  Marital status: Married    Spouse name: Not on file  . Number of children: Not on file  . Years of education: Not on file  . Highest education level: Not on file  Occupational History  . Not on file  Tobacco Use  . Smoking status: Never Smoker  . Smokeless tobacco: Never Used  Vaping Use  . Vaping Use: Never used  Substance and Sexual Activity  . Alcohol use: Yes    Comment: Rarely  . Drug use: No  .  Sexual activity: Not on file  Other Topics Concern  . Not on file  Social History Narrative  . Not on file   Social Determinants of Health   Financial Resource Strain: Not on file  Food Insecurity: Not on file  Transportation Needs: Not on file  Physical Activity: Not on file  Stress: Not on file  Social Connections: Not on file  Intimate Partner Violence: Not on file   Family History  Problem Relation Age of Onset  . Hypertension Father   . Asthma Brother   . Diabetes Brother   . Cancer Paternal Grandmother        Breast   Past Surgical History:  Procedure Laterality Date  . HEMANGIOMA EXCISION  04/2006  . SHOULDER SURGERY Left 2006     Mardella Layman, MD 10/18/20 1444

## 2020-10-18 NOTE — Telephone Encounter (Signed)
Received call from patient.   Reports that he has been experiencing chest tightness and severe HA x3 hours. Reports that he has taken metoprolol and HCTZ as prescribed, but has no way to check BP at work. States that he is currently on his way home.   Denies vision changes, sharp chest pain, neck/ jaw pain, or pain radiating through to arm.   Advised to go to ER for cardiac work up with concern for HTN crisis vs angina/ MI. States that he does not feel like he requires ER visit and declines going.   Sates that he will go home and monitor BP. Will call back with readings.

## 2020-10-18 NOTE — ED Triage Notes (Signed)
Headache x 2 hours.  States he feels tight in his chest and neck area.

## 2020-10-20 ENCOUNTER — Other Ambulatory Visit: Payer: Self-pay | Admitting: Family Medicine

## 2020-11-03 ENCOUNTER — Other Ambulatory Visit: Payer: Self-pay | Admitting: Family Medicine

## 2020-11-07 ENCOUNTER — Ambulatory Visit (INDEPENDENT_AMBULATORY_CARE_PROVIDER_SITE_OTHER): Payer: BC Managed Care – PPO

## 2020-11-07 ENCOUNTER — Encounter: Payer: Self-pay | Admitting: Podiatry

## 2020-11-07 ENCOUNTER — Other Ambulatory Visit: Payer: Self-pay

## 2020-11-07 ENCOUNTER — Ambulatory Visit: Payer: BC Managed Care – PPO | Admitting: Podiatry

## 2020-11-07 DIAGNOSIS — M67472 Ganglion, left ankle and foot: Secondary | ICD-10-CM

## 2020-11-07 DIAGNOSIS — M2042 Other hammer toe(s) (acquired), left foot: Secondary | ICD-10-CM | POA: Diagnosis not present

## 2020-11-07 NOTE — Progress Notes (Signed)
Subjective:  Patient ID: Stephen Allen, male    DOB: 1976-06-07,  MRN: [id number] HPI Chief Complaint  Patient presents with  . Toe Pain    2nd toe left - cyst dorsally x 1 year, red, initially injured it years ago and cyst has been intermittent since, tender at times  . New Patient (Initial Visit)    45 y.o. male presents with the above complaint.   ROS: He denies fever chills nausea vomiting muscle aches pains calf pain back pain chest pain shortness of breath.  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  Past Medical History:  Diagnosis Date  . Arthritis    Phreesia 04/03/2020  . Asthma    Dx'd age 71  . Asthma    Phreesia 04/03/2020  . Migraines   . Obesity    Past Surgical History:  Procedure Laterality Date  . HEMANGIOMA EXCISION  04/2006  . SHOULDER SURGERY Left 2006    Current Outpatient Medications:  .  ALPRAZolam (XANAX) 0.5 MG tablet, Take 1 tablet (0.5 mg total) by mouth 3 (three) times daily as needed for anxiety., Disp: 30 tablet, Rfl: 0 .  diclofenac Sodium (VOLTAREN) 1 % GEL, Apply 1 application topically as needed., Disp: , Rfl:  .  Glucos-Chondroit-Hyaluron-MSM (GLUCOSAMINE CHONDROITIN JOINT PO), Take by mouth., Disp: , Rfl:  .  hydrochlorothiazide (HYDRODIURIL) 25 MG tablet, TAKE (1) TABLET BY MOUTH ONCE DAILY., Disp: 90 tablet, Rfl: 0 .  HYDROcodone-acetaminophen (NORCO/VICODIN) 5-325 MG tablet, Take 1 tablet by mouth every 12 (twelve) hours as needed for severe pain., Disp: 10 tablet, Rfl: 0 .  meloxicam (MOBIC) 15 MG tablet, TAKE 1 TABLET BY MOUTH ONCE DAILY WITH A MEAL., Disp: 30 tablet, Rfl: 0 .  metoprolol succinate (TOPROL-XL) 50 MG 24 hr tablet, TAKE (1) TABLET BY MOUTH ONCE DAILY. (Patient taking differently: Take 50 mg by mouth as needed. ), Disp: 90 tablet, Rfl: 0 .  pantoprazole (PROTONIX) 40 MG tablet, pantoprazole 40 mg tablet,delayed release, Disp: , Rfl:  .  pregabalin (LYRICA) 100 MG capsule, TAKE (1) CAPSULE BY MOUTH TWICE DAILY., Disp: 60 capsule,  Rfl: 0 .  PROAIR HFA 108 (90 Base) MCG/ACT inhaler, USE 2 PUFFS EVERY 4 HOURS AS NEEDED., Disp: 8.5 g, Rfl: 0  Allergies  Allergen Reactions  . Cortisone Other (See Comments)    Hx of retinal detachment  . Other   . Albuterol     Migraine   . Ventolin [Kdc:Albuterol]     Migraines   Review of Systems Objective:  There were no vitals filed for this visit.  General: Well developed, nourished, in no acute distress, alert and oriented x3   Dermatological: Skin is warm, dry and supple bilateral. Nails x 10 are well maintained; remaining integument appears unremarkable at this time. There are no open sores, no preulcerative lesions, no rash or signs of infection present.  Vascular: Dorsalis Pedis artery and Posterior Tibial artery pedal pulses are 2/4 bilateral with immedate capillary fill time. Pedal hair growth present. No varicosities and no lower extremity edema present bilateral.   Neruologic: Grossly intact via light touch bilateral. Vibratory intact via tuning fork bilateral. Protective threshold with Semmes Wienstein monofilament intact to all pedal sites bilateral. Patellar and Achilles deep tendon reflexes 2+ bilateral. No Babinski or clonus noted bilateral.   Musculoskeletal: No gross boney pedal deformities bilateral. No pain, crepitus, or limitation noted with foot and ankle range of motion bilateral. Muscular strength 5/5 in all groups tested bilateral.  Mallet toe deformity with mucoid  cyst overlying the PIPJ.  Previous traumas related.  Gait: Unassisted, Nonantalgic.    Radiographs:  Radiographs taken today demonstrate an osseously mature individual with severe mallet toe deformity distal aspect second toe left.  There is osteoarthritic changes of the DIPJ on radiograph.  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  He would  like to do this sometime in the summer I expressed to him that he needs a least 3 good weeks for me to help heal this.  He understands and is amenable to it I will follow-up with him 2 to 3 weeks prior to any week that he has 3 consecutive weeks off.     Aleph Nickson T. [place], [place]

## 2020-11-27 ENCOUNTER — Other Ambulatory Visit: Payer: Self-pay | Admitting: Family Medicine

## 2020-12-06 ENCOUNTER — Other Ambulatory Visit: Payer: Self-pay | Admitting: Family Medicine

## 2020-12-27 ENCOUNTER — Other Ambulatory Visit: Payer: Self-pay | Admitting: Family Medicine

## 2020-12-28 ENCOUNTER — Other Ambulatory Visit: Payer: Self-pay | Admitting: Family Medicine

## 2020-12-28 NOTE — Telephone Encounter (Signed)
PMP reviewed.  Appears patient takes pregabalin chronically.  Refill given today in place of PCP who is out of office.

## 2020-12-29 NOTE — Telephone Encounter (Signed)
Ok to refill??  Last office visit 04/03/2020.  Last refill 06/10/2019.  Of note, letter sent to patient to schedule OV.

## 2020-12-29 NOTE — Telephone Encounter (Signed)
PDMP reviewed.  Appears patient takes this medication sporadically.  We will refill in place a PCP who is out of office.

## 2021-02-02 ENCOUNTER — Other Ambulatory Visit: Payer: Self-pay | Admitting: Nurse Practitioner

## 2021-02-02 ENCOUNTER — Other Ambulatory Visit: Payer: Self-pay

## 2021-02-02 ENCOUNTER — Ambulatory Visit
Admission: EM | Admit: 2021-02-02 | Discharge: 2021-02-02 | Disposition: A | Discharge status: Home / Self Care | Payer: BC Managed Care – PPO | Attending: Emergency Medicine | Admitting: Emergency Medicine

## 2021-02-02 ENCOUNTER — Other Ambulatory Visit: Payer: Self-pay | Admitting: Family Medicine

## 2021-02-02 ENCOUNTER — Encounter: Payer: Self-pay | Admitting: Family Medicine

## 2021-02-02 ENCOUNTER — Ambulatory Visit (INDEPENDENT_AMBULATORY_CARE_PROVIDER_SITE_OTHER): Payer: BC Managed Care – PPO

## 2021-02-02 ENCOUNTER — Encounter: Payer: Self-pay | Admitting: Emergency Medicine

## 2021-02-02 DIAGNOSIS — S3210XA Unspecified fracture of sacrum, initial encounter for closed fracture: Secondary | ICD-10-CM

## 2021-02-02 DIAGNOSIS — Z23 Encounter for immunization: Secondary | ICD-10-CM | POA: Diagnosis not present

## 2021-02-02 DIAGNOSIS — S322XXA Fracture of coccyx, initial encounter for closed fracture: Secondary | ICD-10-CM

## 2021-02-02 MED ORDER — TETANUS-DIPHTH-ACELL PERTUSSIS 5-2.5-18.5 LF-MCG/0.5 IM SUSY
0.5000 mL | PREFILLED_SYRINGE | Freq: Once | INTRAMUSCULAR | Status: AC
  Administered 2021-02-02: 0.5 mL via INTRAMUSCULAR

## 2021-02-02 MED ORDER — HYDROCODONE-ACETAMINOPHEN 5-325 MG PO TABS: 1.0000 | ORAL_TABLET | Freq: Two times a day (BID) | ORAL | 0 refills | Status: DC | PRN

## 2021-02-02 MED ORDER — MELOXICAM 15 MG PO TABS: 15.0000 mg | ORAL_TABLET | Freq: Every day | ORAL | 0 refills | Status: DC

## 2021-02-02 NOTE — Telephone Encounter (Signed)
Ok to refill??  Last office visit 04/03/2020.  Last refill 12/28/2020.  Ok to add refills to prescription?

## 2021-02-02 NOTE — ED Provider Notes (Signed)
[facility]   [id number] 02/02/21 Arrival Time: 0254  CC: Tailbone PAIN  SUBJECTIVE: History from: patient. Stephen Allen is a 45 y.o. male complains of tailbone pain and injury x 3 days ago.  Fall backwards on tailbone  Localizes the pain to the tailbone.  Describes the pain as intermittent and achy in character.  Has tried OTC medications without relief.  Symptoms are made worse with sitting.  Denies similar symptoms in the past.  Complains of associated swelling and bruising.  Denies fever, chills, erythema, weakness, numbness and tingling, saddle paresthesias, loss of bowel or bladder function.      Scratched leg 1 day ago.  Would like to update tetanus.    Immunization History  Administered Date(s) Administered   Tdap 03/03/2007, 02/02/2021     ROS: As per HPI.  All other pertinent ROS negative.     Past Medical History:  Diagnosis Date   Arthritis    Phreesia 04/03/2020   Asthma    Dx'd age 36   Asthma    Phreesia 04/03/2020   Migraines    Obesity    Past Surgical History:  Procedure Laterality Date   HEMANGIOMA EXCISION  04/2006   SHOULDER SURGERY Left 2006   Allergies  Allergen Reactions   Cortisone Other (See Comments)    Hx of retinal detachment   Other    Albuterol     Migraine    Ventolin [Kdc:Albuterol]     Migraines   No current facility-administered medications on file prior to encounter.   Current Outpatient Medications on File Prior to Encounter  Medication Sig Dispense Refill   Glucos-Chondroit-Hyaluron-MSM (GLUCOSAMINE CHONDROITIN JOINT PO) Take by mouth.     hydrochlorothiazide (HYDRODIURIL) 25 MG tablet TAKE (1) TABLET BY MOUTH ONCE DAILY. 90 tablet 0   pregabalin (LYRICA) 100 MG capsule TAKE (1) CAPSULE BY MOUTH TWICE DAILY. 60 capsule 0   ALPRAZolam (XANAX) 0.5 MG tablet TAKE 1 TABLET BY MOUTH 3 TIMES A DAY AS NEEDED FOR ANXIETY. 30 tablet 0   diclofenac Sodium (VOLTAREN) 1 % GEL Apply 1 application topically as needed.      HYDROcodone-acetaminophen (NORCO/VICODIN) 5-325 MG tablet Take 1 tablet by mouth every 12 (twelve) hours as needed for severe pain. 10 tablet 0   metoprolol succinate (TOPROL-XL) 50 MG 24 hr tablet TAKE (1) TABLET BY MOUTH ONCE DAILY. (Patient taking differently: Take 50 mg by mouth as needed.) 90 tablet 0   pantoprazole (PROTONIX) 40 MG tablet pantoprazole 40 mg tablet,delayed release     PROAIR HFA 108 (90 Base) MCG/ACT inhaler USE 2 PUFFS EVERY 4 HOURS AS NEEDED. 8.5 g 0   Social History   Socioeconomic History   Marital status: Married    Spouse name: Not on file   Number of children: Not on file   Years of education: Not on file   Highest education level: Not on file  Occupational History   Not on file  Tobacco Use   Smoking status: Never   Smokeless tobacco: Never  Vaping Use   Vaping Use: Never used  Substance and Sexual Activity   Alcohol use: Yes    Comment: Rarely   Drug use: No   Sexual activity: Not on file  Other Topics Concern   Not on file  Social History Narrative   Not on file   Social Determinants of Health   Financial Resource Strain: Not on file  Food Insecurity: Not on file  Transportation Needs: Not on file  Physical Activity: Not on file  Stress: Not on file  Social Connections: Not on file  Intimate Partner Violence: Not on file   Family History  Problem Relation Age of Onset   Hypertension Father    Asthma Brother    Diabetes Brother    Cancer Paternal Grandmother        Breast    OBJECTIVE:  Vitals:   02/02/21 0959 02/02/21 1000  BP: 126/85   Pulse: 78   Resp: 18   Temp: 98.2 F (36.8 C)   TempSrc: Oral   SpO2: 96%   Weight:  240 lb (108.9 kg)  Height:  5\' 9"  (1.753 m)    General appearance: ALERT; in no acute distress.  Head: NCAT Lungs: Normal respiratory effort Musculoskeletal: Tailbone Inspection: Bruising over tailbone Palpation: TTP over tailbone/ distal L-spine Strength: 5/5 hip flexion, 5/5 hip extension Skin:  warm and dry Neurologic: Ambulates without difficulty Psychological: alert and cooperative; normal mood and affect  DIAGNOSTIC STUDIES:  DG Sacrum/Coccyx  Result Date: 02/02/2021 CLINICAL DATA:  Sacrococcygeal pain after fall EXAM: SACRUM AND COCCYX - 2+ VIEW COMPARISON:  None. FINDINGS: Acute minimally displaced fracture of the S5 segment of the distal sacrum. SI joints are intact without diastasis. 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 IMPRESSION: 1. Acute minimally displaced fracture of the distal sacrum. 2. 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 Electronically Signed   By: 02/04/2021 D.O.   On: 02/02/2021 10:48     X-rays negative for bony abnormalities including fracture, or dislocation.  No soft tissue swelling.    I have reviewed the x-rays myself and the radiologist interpretation. I am in agreement with the radiologist interpretation.     ASSESSMENT & PLAN:  1. Closed fracture of sacrum and coccyx, initial encounter (HCC)     Meds ordered this encounter  Medications   Tdap (BOOSTRIX) injection 0.5 mL   meloxicam (MOBIC) 15 MG tablet    Sig: Take 1 tablet (15 mg total) by mouth daily.    Dispense:  30 tablet    Refill:  0    Order Specific Question:   Supervising Provider    Answer:   02/04/2021 Eustace Moore   X-rays positive for sacrum fracture Continue conservative management of rest, ice, and sitting on pillow Take mobic as needed for pain relief (may cause abdominal discomfort, ulcers, and GI bleeds avoid taking with other NSAIDs) Follow up with PCP if symptoms persist Return or go to the ER if you have any new or worsening symptoms (fever, chills, chest pain, abdominal pain, changes in bowel or bladder habits, pain radiating into lower legs, etc...)   Tetanus updated  Reviewed expectations re: course of current medical issues. Questions answered. Outlined signs and symptoms indicating need for more  acute intervention. Patient verbalized understanding. After Visit Summary given.     [[id number]], PA-C 02/02/21 1100

## 2021-02-02 NOTE — Discharge Instructions (Addendum)
X-rays positive for sacrum fracture Continue conservative management of rest, ice, and sitting on pillow Take mobic as needed for pain relief (may cause abdominal discomfort, ulcers, and GI bleeds avoid taking with other NSAIDs) Follow up with PCP if symptoms persist Return or go to the ER if you have any new or worsening symptoms (fever, chills, chest pain, abdominal pain, changes in bowel or bladder habits, pain radiating into lower legs, etc...)   Tetanus updated

## 2021-02-02 NOTE — ED Triage Notes (Signed)
Pt here after fall 3 days ago. Slipped and fell directly on tailbone. Would like to see if it is broken.

## 2021-02-04 IMAGING — DX DG FINGER THUMB 2+V*R*
3 series · 3 of 3 positions shown · non-contrast
Comparison: None.

CLINICAL DATA: Right thumb pain since an injury snow tubing
yesterday. Initial encounter.

EXAM:
RIGHT THUMB 2+V

[finger pa]
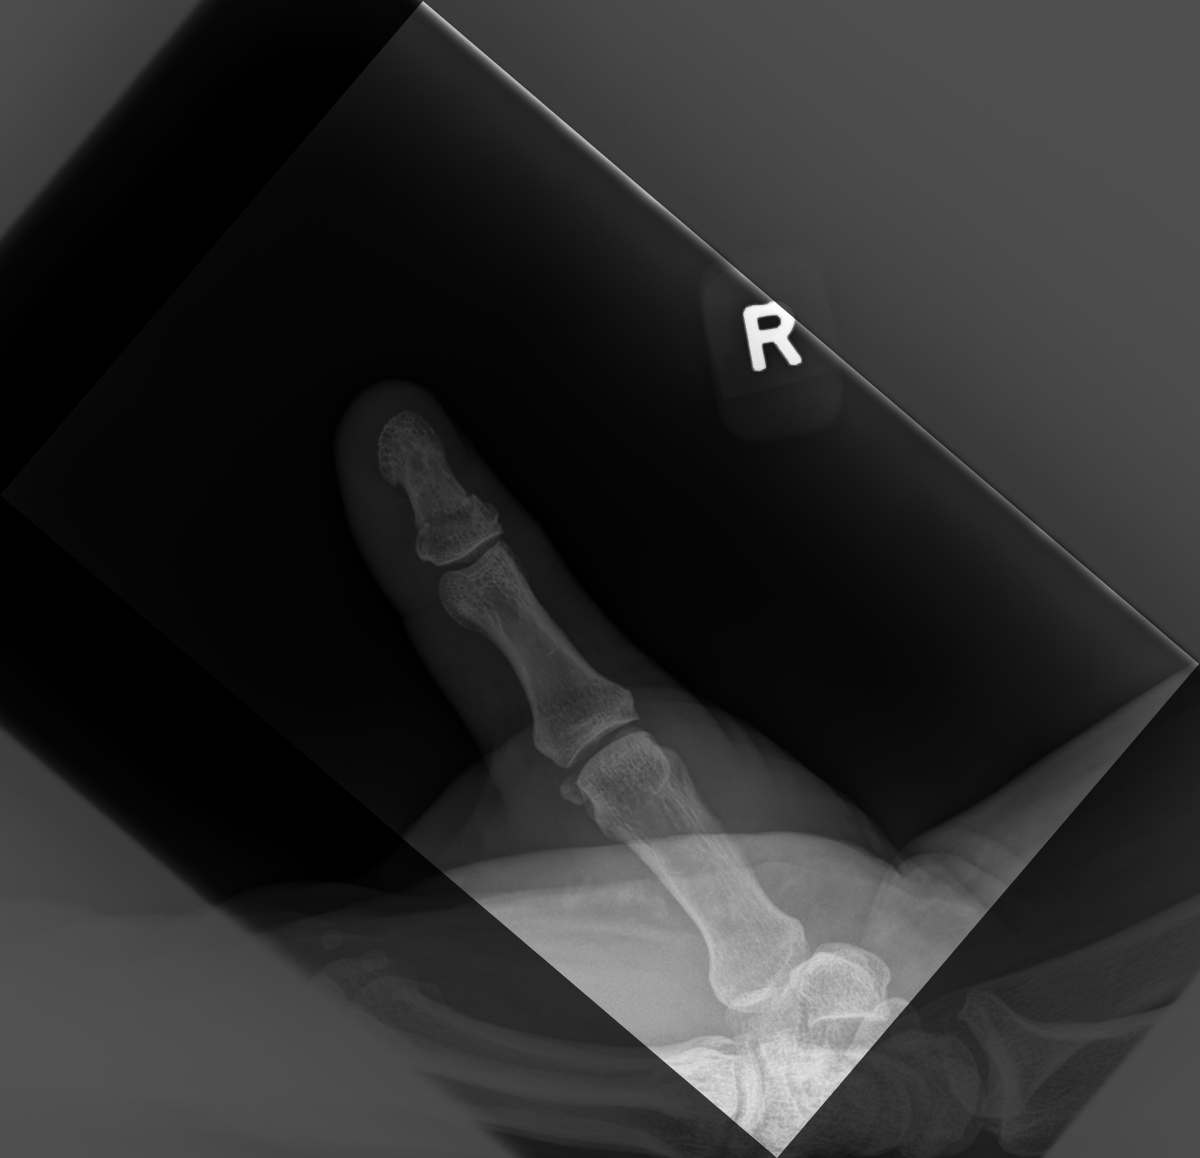

[finger mlo]
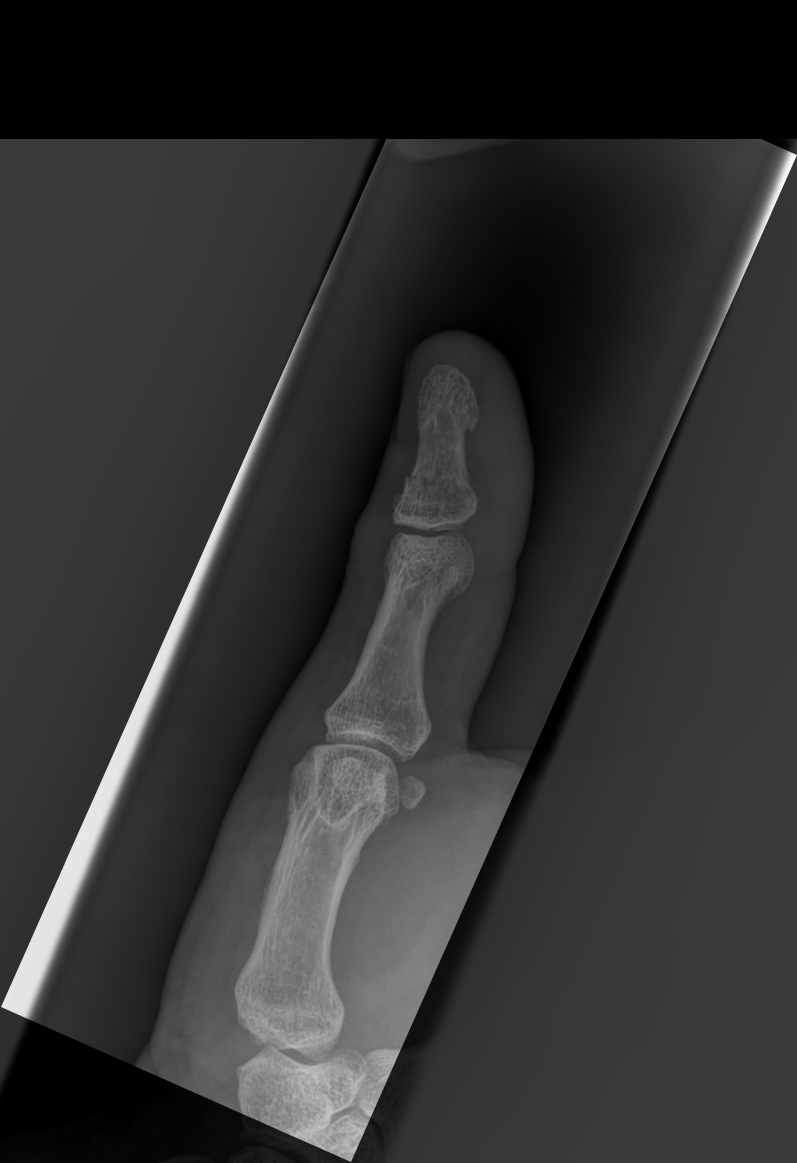

[2. finger lat]
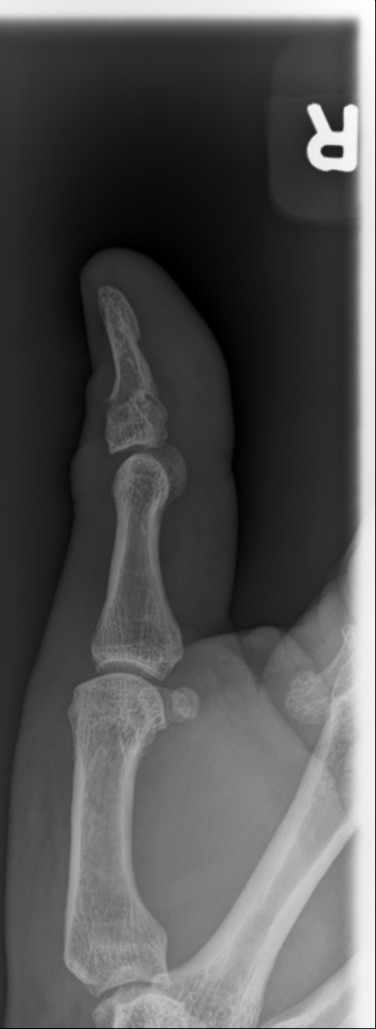

[3 of 3 positions shown; findings below may reference images not displayed]

FINDINGS: The patient has an acute transverse fracture through the proximal
metaphysis of the distal phalanx of the thumb. The fracture is
nondisplaced and does not involve the articular surface.
IMPRESSION: Nondisplaced fracture proximal metaphysis distal phalanx of the
right thumb.

## 2021-02-19 ENCOUNTER — Other Ambulatory Visit: Payer: Self-pay

## 2021-02-19 ENCOUNTER — Ambulatory Visit: Payer: BC Managed Care – PPO | Admitting: Family Medicine

## 2021-02-19 VITALS — BP 138/76 | HR 88 | Temp 98.4°F | Resp 16 | Wt 242.0 lb

## 2021-02-19 DIAGNOSIS — M797 Fibromyalgia: Secondary | ICD-10-CM

## 2021-02-19 DIAGNOSIS — M159 Polyosteoarthritis, unspecified: Secondary | ICD-10-CM

## 2021-02-19 DIAGNOSIS — M8949 Other hypertrophic osteoarthropathy, multiple sites: Secondary | ICD-10-CM

## 2021-02-19 DIAGNOSIS — R5383 Other fatigue: Secondary | ICD-10-CM

## 2021-02-19 DIAGNOSIS — I10 Essential (primary) hypertension: Secondary | ICD-10-CM | POA: Diagnosis not present

## 2021-02-19 MED ORDER — DULOXETINE HCL 60 MG PO CPEP: 60.0000 mg | ORAL_CAPSULE | Freq: Every day | ORAL | 3 refills | Status: DC

## 2021-02-19 NOTE — Progress Notes (Signed)
Subjective:    Patient ID: Stephen Allen, male    DOB: [birth year], 45 y.o.   MRN: [id number]   08/06/2018 Patient never tried the Lyrica.  He is here today to discuss options for pain control.  He states that he has been dealing with arthritis since he was 13.  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  He had shoulder surgery on his left shoulder.  He deals with chronic pain in both shoulders left greater than right, both hips, and both knees.  He also has a history of chronic low back pain.  He states that his back frequently when out on him when he was younger.  He would take Flexeril and hydrocodone as needed for back pain and back spasms.  He has not taken any of that medication in last 3 years however he is using ibuprofen 2-3 times every day.  Patient states that he lives in constant pain.  The pain involves his lower back, his thighs, his knees, even his shins hurt.  He also has pain in both shoulders as well as in his hands.  He denies any fevers chills or rashes.  He was even sent to see a rheumatologist however he does not remember any blood panel that was drawn to evaluate for autoimmune diseases.  At that time, my plan was: Spent 15 to 20 minutes today with the patient discussing his past history.  I am concerned about the level of arthritis he has in such a relatively young age.  He was extremely active when he was younger.  He competed in track, was a Camera operator, was a Event organiser.  Therefore is possible that repetitive trauma could have expedited his osteoarthritis and then his weight is only exacerbated the problem.  However him also concerned there may be an underlying autoimmune illness or possibly even an element of fibromyalgia.  Therefore we will try empirically Lyrica 75 mg p.o. twice daily and see if this helps his pain to avoid habit-forming medications.  I will also evaluate for  autoimmune disease with a sedimentation rate, CRP, rheumatoid factor, and an ANA.  If lab work is negative and Lyrica shows no benefit, consider trying Cymbalta versus trying tramadol.  02/01/19 Patient is here today for follow-up.  He has seen definite benefit since starting Lyrica 75 mg twice daily.  He denies any sedation.  He denies any dizziness.  He continues to suffer pain all over his body.  Autoimmune work-up was negative.  He still takes meloxicam daily for joint pain.  He continues to report pain all over his body.  He is interested in possibly increasing the Lyrica to achieve better pain control and improve quality of life.  We discussed the risk of gastrointestinal bleeding as well as possible renal damage due to NSAID overuse.  Gastrointestinal side effects.  His blood pressure today is elevated.  He states that he gets similar numbers at home.  Typically his systolic blood pressure is higher than 140 and his diastolic blood pressure is higher than 90.  He denies any chest pain shortness of breath or dyspnea on exertion.  02/29/20 Patient returns today complaining of pain all over his body.  He states that he has a migratory pain that seems to change on a day-to-day basis.  Sometimes he will hurt in his hips.  Sometimes he will hurt in his thighs.  Sometimes  he will hurt in his calves.  At other times he will hurt in his shoulders.  The pain seems to move to different areas on a daily basis.  He reports moderate to severe pain all over his body.  There is no specific one location that hurts the worst.  Previously I worked the patient up for autoimmune diseases with normal lab work.  Initially he saw benefit from Lyrica however patient denies any help with pain at the present time.  He denies any rashes.  He denies any shortness of breath although he has been getting some substernal chest pressure.  This occurs after eating usually within an hour.  He will feel a burning pressure-like sensation near  the xiphoid process.  He is taking meloxicam on a daily basis due to pain.  He denies any melena or hematochezia.  At that time, my plan was: Again I will repeat a basic laboratory panel to evaluate for possible autoimmune diseases.  Given the fact the pain is so widespread and migratory in nature I do not think this would be simple osteoarthritis.  I will check a CBC to evaluate for any bone marrow abnormalities.  I will check a CK level to evaluate for any evidence of myositis.  I will check a rheumatoid factor along with an ANA and a sedimentation rate.  I will screen the patient for Lyme disease.  If lab work is normal, I would recommend a second opinion with rheumatology and if they agree, I would believe the patient has a myofascial pain syndrome or fibromyalgia.  I will also start the patient on Protonix 40 mg a day for the discomfort at the distal end of his xiphoid process that occurs after eating.  I suspect that he may have some mild gastritis due to NSAID overuse however he states that if he stops the meloxicam the pain becomes unbearable.  Patient also has a cystlike mass at the tip of his left third toe.  It is just distal to the IP joint near the toenail.  It is roughly 3 mm in diameter.  It is a cystlike lesion that appears to be a digital mucous cyst.  The patient is comfortable simply watching it.  If it starts to grow I would recommend an excision under the care of a podiatrist  02/19/21 Patient fell around 7/15 and suffered a fracture cocyx: FINDINGS: Acute minimally displaced fracture of the S5 segment of the distal sacrum. SI joints are intact without diastasis. 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  Patient states that the pain in his tailbone has gotten better.  He is seldom having to use the pain medication that I gave him.  He primarily uses it at night to get some sleep if he has to use it at all.  However he continues to deal with chronic pain all over  his body.  He is working with Dr. Despina Hick due to bilateral knee arthritis.  Were he older, his orthopedist is commented that they would already be doing knee replacements however they are trying to hold off as much as possible.  He is currently doing viscosupplementation injections.  He is also taking meloxicam on a daily basis in addition to the Lyrica which she takes for fibromyalgia.  He does believe that the Lyrica helps some but it is not a panacea certainly for his pain.  He also has bilateral advanced osteoarthritis in both hips as evidenced on the x-ray of his sacrum.  He also deals with bilateral shoulder pain.  On 2 separate occasions we have worked the patient up for autoimmune diseases and the lab work came back normal.  I have even gotten a second opinion with rheumatology but they did not find anything suspicious in his lab work to suggest an underlying autoimmune process.  Therefore the patient is trying to avoid pain medication.  He is currently on Lyrica on a daily basis and using meloxicam on a daily basis.  He would be interested in possibly meeting with the pain clinic to see what other conservative options exist for him.  His blood pressure today is borderline.  He is taking hydrochlorothiazide on a daily basis.  He denies any chest pain or shortness of breath.  He does report chronic fatigue Past Medical History:  Diagnosis Date   Arthritis    Phreesia 04/03/2020   Asthma    Dx'd age 78   Asthma    Phreesia 04/03/2020   Migraines    Obesity    Past Surgical History:  Procedure Laterality Date   HEMANGIOMA EXCISION  04/2006   SHOULDER SURGERY Left 2006   Current Outpatient Medications on File Prior to Visit  Medication Sig Dispense Refill   ALPRAZolam (XANAX) 0.5 MG tablet TAKE 1 TABLET BY MOUTH 3 TIMES A DAY AS NEEDED FOR ANXIETY. 30 tablet 0   diclofenac Sodium (VOLTAREN) 1 % GEL Apply 1 application topically as needed.     Glucos-Chondroit-Hyaluron-MSM (GLUCOSAMINE  CHONDROITIN JOINT PO) Take by mouth.     hydrochlorothiazide (HYDRODIURIL) 25 MG tablet TAKE (1) TABLET BY MOUTH ONCE DAILY. 90 tablet 0   HYDROcodone-acetaminophen (NORCO/VICODIN) 5-325 MG tablet Take 1 tablet by mouth every 12 (twelve) hours as needed for severe pain. 30 tablet 0   meloxicam (MOBIC) 15 MG tablet Take 1 tablet (15 mg total) by mouth daily. 30 tablet 0   metoprolol succinate (TOPROL-XL) 50 MG 24 hr tablet TAKE (1) TABLET BY MOUTH ONCE DAILY. (Patient taking differently: Take 50 mg by mouth as needed.) 90 tablet 0   pantoprazole (PROTONIX) 40 MG tablet pantoprazole 40 mg tablet,delayed release     pregabalin (LYRICA) 100 MG capsule TAKE (1) CAPSULE BY MOUTH TWICE DAILY. 60 capsule 3   PROAIR HFA 108 (90 Base) MCG/ACT inhaler USE 2 PUFFS EVERY 4 HOURS AS NEEDED. 8.5 g 0   No current facility-administered medications on file prior to visit.   Allergies  Allergen Reactions   Cortisone Other (See Comments)    Hx of retinal detachment   Other    Albuterol     Migraine    Ventolin [Kdc:Albuterol]     Migraines   Social History   Socioeconomic History   Marital status: Married    Spouse name: Not on file   Number of children: Not on file   Years of education: Not on file   Highest education level: Not on file  Occupational History   Not on file  Tobacco Use   Smoking status: Never   Smokeless tobacco: Never  Vaping Use   Vaping Use: Never used  Substance and Sexual Activity   Alcohol use: Yes    Comment: Rarely   Drug use: No   Sexual activity: Not on file  Other Topics Concern   Not on file  Social History Narrative   Not on file   Social Determinants of Health   Financial Resource Strain: Not on file  Food Insecurity: Not on file  Transportation Needs: Not on  file  Physical Activity: Not on file  Stress: Not on file  Social Connections: Not on file  Intimate Partner Violence: Not on file     Review of Systems  All other systems reviewed and are  negative.     Objective:   Physical Exam Vitals reviewed.  Constitutional:      General: He is not in acute distress.    Appearance: Normal appearance. He is well-developed. He is obese. He is not diaphoretic.  Cardiovascular:     Rate and Rhythm: Normal rate and regular rhythm.     Heart sounds: Normal heart sounds. No murmur heard.   No friction rub. No gallop.  Pulmonary:     Effort: Pulmonary effort is normal.     Breath sounds: Normal breath sounds.  Lymphadenopathy:     Cervical: No cervical adenopathy.  Neurological:     Mental Status: He is alert.          Assessment & Plan:  Fatigue, unspecified type - Plan: CBC with Differential/Platelet, COMPLETE METABOLIC PANEL WITH GFR, TSH, Testosterone Total,Free,Bio, Males  Benign essential HTN  Fibromyalgia  Primary osteoarthritis involving multiple joints We had a long discussion about pain management.  I would like to try Cymbalta 60 mg daily in addition to his Lyrica.  First is not habit-forming.  Second it is evidence showing benefit and osteoarthritis in the knees and hips and back.  Third it may help modulate pain due to fibromyalgia and complement the Lyrica.  The patient is willing to try it.  However I would also like to get a second opinion with a pain clinic.  Patient has been offered narcotics and wants to try to avoid them which I certainly support.  Therefore I would like to see if they have any other suggestions to help modify his pain to improve his quality of life.  Third given his fatigue I would like to check a TSH, total testosterone, and CBC.Marland Kitchen

## 2021-02-20 LAB — COMPLETE METABOLIC PANEL WITH GFR
AG Ratio: 2 (calc) (ref 1.0–2.5)
ALT: 13 U/L (ref 9–46)
AST: 12 U/L (ref 10–40)
Albumin: 4.5 g/dL (ref 3.6–5.1)
Alkaline phosphatase (APISO): 69 U/L (ref 36–130)
BUN: 12 mg/dL (ref 7–25)
CO2: 25 mmol/L (ref 20–32)
Calcium: 9.1 mg/dL (ref 8.6–10.3)
Chloride: 105 mmol/L (ref 98–110)
Creat: 1.01 mg/dL (ref 0.60–1.29)
Globulin: 2.2 g/dL (calc) (ref 1.9–3.7)
Glucose, Bld: 118 mg/dL — ABNORMAL HIGH (ref 65–99)
Potassium: 4 mmol/L (ref 3.5–5.3)
Sodium: 140 mmol/L (ref 135–146)
Total Bilirubin: 0.5 mg/dL (ref 0.2–1.2)
Total Protein: 6.7 g/dL (ref 6.1–8.1)
eGFR: 93 mL/min/{1.73_m2} (ref >=60)

## 2021-02-20 LAB — CBC WITH DIFFERENTIAL/PLATELET
Absolute Monocytes: 411 cells/uL (ref 200–950)
Basophils Absolute: 42 cells/uL (ref 0–200)
Basophils Relative: 0.8 %
Eosinophils Absolute: 120 cells/uL (ref 15–500)
Eosinophils Relative: 2.3 %
HCT: 44.4 % (ref 38.5–50.0)
Hemoglobin: 14.6 g/dL (ref 13.2–17.1)
Lymphs Abs: 1612 cells/uL (ref 850–3900)
MCH: 28.6 pg (ref 27.0–33.0)
MCHC: 32.9 g/dL (ref 32.0–36.0)
MCV: 87.1 fL (ref 80.0–100.0)
MPV: 10.3 fL (ref 7.5–12.5)
Monocytes Relative: 7.9 %
Neutro Abs: 3016 cells/uL (ref 1500–7800)
Neutrophils Relative %: 58 %
Platelets: 263 10*3/uL (ref 140–400)
RBC: 5.1 10*6/uL (ref 4.20–5.80)
RDW: 13.3 % (ref 11.0–15.0)
Total Lymphocyte: 31 %
WBC: 5.2 10*3/uL (ref 3.8–10.8)

## 2021-02-20 LAB — TESTOSTERONE TOTAL,FREE,BIO, MALES
Albumin: 4.5 g/dL (ref 3.6–5.1)
Sex Hormone Binding: 23 nmol/L (ref 10–50)
Testosterone, Bioavailable: 99.5 ng/dL — ABNORMAL LOW (ref 110.0–575.0)
Testosterone, Free: 48.4 pg/mL (ref 46.0–224.0)
Testosterone: 290 ng/dL (ref 250–827)

## 2021-02-20 LAB — TSH: TSH: 3.04 mIU/L (ref 0.40–4.50)

## 2021-02-22 ENCOUNTER — Encounter: Payer: Self-pay | Admitting: Psychology

## 2021-03-28 ENCOUNTER — Other Ambulatory Visit: Payer: Self-pay | Admitting: Family Medicine

## 2021-04-06 ENCOUNTER — Other Ambulatory Visit: Payer: Self-pay

## 2021-04-06 ENCOUNTER — Encounter: Payer: Self-pay | Admitting: Family Medicine

## 2021-04-06 ENCOUNTER — Ambulatory Visit (INDEPENDENT_AMBULATORY_CARE_PROVIDER_SITE_OTHER): Payer: BC Managed Care – PPO | Admitting: Family Medicine

## 2021-04-06 VITALS — BP 136/68 | HR 60 | Temp 98.2°F | Resp 16 | Ht 69.5 in | Wt 236.0 lb

## 2021-04-06 DIAGNOSIS — M797 Fibromyalgia: Secondary | ICD-10-CM

## 2021-04-06 DIAGNOSIS — E669 Obesity, unspecified: Secondary | ICD-10-CM

## 2021-04-06 DIAGNOSIS — E7800 Pure hypercholesterolemia, unspecified: Secondary | ICD-10-CM

## 2021-04-06 DIAGNOSIS — Z1212 Encounter for screening for malignant neoplasm of rectum: Secondary | ICD-10-CM

## 2021-04-06 DIAGNOSIS — Z0000 Encounter for general adult medical examination without abnormal findings: Secondary | ICD-10-CM | POA: Diagnosis not present

## 2021-04-06 DIAGNOSIS — I10 Essential (primary) hypertension: Secondary | ICD-10-CM

## 2021-04-06 DIAGNOSIS — Z1211 Encounter for screening for malignant neoplasm of colon: Secondary | ICD-10-CM

## 2021-04-06 DIAGNOSIS — Z13220 Encounter for screening for lipoid disorders: Secondary | ICD-10-CM | POA: Diagnosis not present

## 2021-04-06 LAB — COMPLETE METABOLIC PANEL WITH GFR
AG Ratio: 1.8 (calc) (ref 1.0–2.5)
ALT: 15 U/L (ref 9–46)
AST: 13 U/L (ref 10–40)
Albumin: 4.5 g/dL (ref 3.6–5.1)
Alkaline phosphatase (APISO): 73 U/L (ref 36–130)
BUN: 11 mg/dL (ref 7–25)
CO2: 28 mmol/L (ref 20–32)
Calcium: 9.2 mg/dL (ref 8.6–10.3)
Chloride: 101 mmol/L (ref 98–110)
Creat: 0.98 mg/dL (ref 0.60–1.29)
Globulin: 2.5 g/dL (calc) (ref 1.9–3.7)
Glucose, Bld: 72 mg/dL (ref 65–99)
Potassium: 3.6 mmol/L (ref 3.5–5.3)
Sodium: 139 mmol/L (ref 135–146)
Total Bilirubin: 0.5 mg/dL (ref 0.2–1.2)
Total Protein: 7 g/dL (ref 6.1–8.1)
eGFR: 97 mL/min/{1.73_m2} (ref >=60)

## 2021-04-06 LAB — LIPID PANEL
Cholesterol: 198 mg/dL (ref <200)
HDL: 44 mg/dL (ref >=40)
LDL Cholesterol (Calc): 124 mg/dL (calc) — ABNORMAL HIGH
Non-HDL Cholesterol (Calc): 154 mg/dL (calc) — ABNORMAL HIGH (ref <130)
Total CHOL/HDL Ratio: 4.5 (calc) (ref <5.0)
Triglycerides: 179 mg/dL — ABNORMAL HIGH (ref <150)

## 2021-04-06 LAB — EXTRA LAV TOP TUBE

## 2021-04-06 MED ORDER — SILDENAFIL CITRATE 100 MG PO TABS: 50.0000 mg | ORAL_TABLET | Freq: Every day | ORAL | 11 refills | Status: DC | PRN

## 2021-04-06 MED ORDER — PREGABALIN 50 MG PO CAPS: ORAL_CAPSULE | ORAL | 3 refills | Status: DC

## 2021-04-06 NOTE — Progress Notes (Signed)
Subjective:    Patient ID: Stephen Allen, male    DOB: [birth year], 45 y.o.   MRN: [id number]   08/06/2018 Patient never tried the Lyrica.  He is here today to discuss options for pain control.  He states that he has been dealing with arthritis since he was 13.  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  He had shoulder surgery on his left shoulder.  He deals with chronic pain in both shoulders left greater than right, both hips, and both knees.  He also has a history of chronic low back pain.  He states that his back frequently when out on him when he was younger.  He would take Flexeril and hydrocodone as needed for back pain and back spasms.  He has not taken any of that medication in last 3 years however he is using ibuprofen 2-3 times every day.  Patient states that he lives in constant pain.  The pain involves his lower back, his thighs, his knees, even his shins hurt.  He also has pain in both shoulders as well as in his hands.  He denies any fevers chills or rashes.  He was even sent to see a rheumatologist however he does not remember any blood panel that was drawn to evaluate for autoimmune diseases.  At that time, my plan was: Spent 15 to 20 minutes today with the patient discussing his past history.  I am concerned about the level of arthritis he has in such a relatively young age.  He was extremely active when he was younger.  He competed in track, was a Camera operator, was a Event organiser.  Therefore is possible that repetitive trauma could have expedited his osteoarthritis and then his weight is only exacerbated the problem.  However him also concerned there may be an underlying autoimmune illness or possibly even an element of fibromyalgia.  Therefore we will try empirically Lyrica 75 mg p.o. twice daily and see if this helps his pain to avoid habit-forming medications.  I will also evaluate for  autoimmune disease with a sedimentation rate, CRP, rheumatoid factor, and an ANA.  If lab work is negative and Lyrica shows no benefit, consider trying Cymbalta versus trying tramadol.  02/01/19 Patient is here today for follow-up.  He has seen definite benefit since starting Lyrica 75 mg twice daily.  He denies any sedation.  He denies any dizziness.  He continues to suffer pain all over his body.  Autoimmune work-up was negative.  He still takes meloxicam daily for joint pain.  He continues to report pain all over his body.  He is interested in possibly increasing the Lyrica to achieve better pain control and improve quality of life.  We discussed the risk of gastrointestinal bleeding as well as possible renal damage due to NSAID overuse.  Gastrointestinal side effects.  His blood pressure today is elevated.  He states that he gets similar numbers at home.  Typically his systolic blood pressure is higher than 140 and his diastolic blood pressure is higher than 90.  He denies any chest pain shortness of breath or dyspnea on exertion.  02/29/20 Patient returns today complaining of pain all over his body.  He states that he has a migratory pain that seems to change on a day-to-day basis.  Sometimes he will hurt in his hips.  Sometimes he will hurt in his thighs.  Sometimes  he will hurt in his calves.  At other times he will hurt in his shoulders.  The pain seems to move to different areas on a daily basis.  He reports moderate to severe pain all over his body.  There is no specific one location that hurts the worst.  Previously I worked the patient up for autoimmune diseases with normal lab work.  Initially he saw benefit from Lyrica however patient denies any help with pain at the present time.  He denies any rashes.  He denies any shortness of breath although he has been getting some substernal chest pressure.  This occurs after eating usually within an hour.  He will feel a burning pressure-like sensation near  the xiphoid process.  He is taking meloxicam on a daily basis due to pain.  He denies any melena or hematochezia.  At that time, my plan was: Again I will repeat a basic laboratory panel to evaluate for possible autoimmune diseases.  Given the fact the pain is so widespread and migratory in nature I do not think this would be simple osteoarthritis.  I will check a CBC to evaluate for any bone marrow abnormalities.  I will check a CK level to evaluate for any evidence of myositis.  I will check a rheumatoid factor along with an ANA and a sedimentation rate.  I will screen the patient for Lyme disease.  If lab work is normal, I would recommend a second opinion with rheumatology and if they agree, I would believe the patient has a myofascial pain syndrome or fibromyalgia.  I will also start the patient on Protonix 40 mg a day for the discomfort at the distal end of his xiphoid process that occurs after eating.  I suspect that he may have some mild gastritis due to NSAID overuse however he states that if he stops the meloxicam the pain becomes unbearable.  Patient also has a cystlike mass at the tip of his left third toe.  It is just distal to the IP joint near the toenail.  It is roughly 3 mm in diameter.  It is a cystlike lesion that appears to be a digital mucous cyst.  The patient is comfortable simply watching it.  If it starts to grow I would recommend an excision under the care of a podiatrist  02/19/21 Patient fell around 7/15 and suffered a fracture cocyx: FINDINGS: Acute minimally displaced fracture of the S5 segment of the distal sacrum. SI joints are intact without diastasis. 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  Patient states that the pain in his tailbone has gotten better.  He is seldom having to use the pain medication that I gave him.  He primarily uses it at night to get some sleep if he has to use it at all.  However he continues to deal with chronic pain all over  his body.  He is working with Dr. Despina Hick due to bilateral knee arthritis.  Were he older, his orthopedist is commented that they would already be doing knee replacements however they are trying to hold off as much as possible.  He is currently doing viscosupplementation injections.  He is also taking meloxicam on a daily basis in addition to the Lyrica which she takes for fibromyalgia.  He does believe that the Lyrica helps some but it is not a panacea certainly for his pain.  He also has bilateral advanced osteoarthritis in both hips as evidenced on the x-ray of his sacrum.  He also deals with bilateral shoulder pain.  On 2 separate occasions we have worked the patient up for autoimmune diseases and the lab work came back normal.  I have even gotten a second opinion with rheumatology but they did not find anything suspicious in his lab work to suggest an underlying autoimmune process.  Therefore the patient is trying to avoid pain medication.  He is currently on Lyrica on a daily basis and using meloxicam on a daily basis.  He would be interested in possibly meeting with the pain clinic to see what other conservative options exist for him.  His blood pressure today is borderline.  He is taking hydrochlorothiazide on a daily basis.  He denies any chest pain or shortness of breath.  He does report chronic fatigue.  At that tim, my plan was:  We had a long discussion about pain management.  I would like to try Cymbalta 60 mg daily in addition to his Lyrica.  First is not habit-forming.  Second it is evidence showing benefit and osteoarthritis in the knees and hips and back.  Third it may help modulate pain due to fibromyalgia and complement the Lyrica.  The patient is willing to try it.  However I would also like to get a second opinion with a pain clinic.  Patient has been offered narcotics and wants to try to avoid them which I certainly support.  Therefore I would like to see if they have any other suggestions to  help modify his pain to improve his quality of life.  Third given his fatigue I would like to check a TSH, total testosterone, and CBC..  04/06/21 Patient is here today for a complete physical exam.  Fortunately he said the Cymbalta has really helped with the pain.  He states that helps to numb the nerve pain that he is having in both legs.  The combination of the Cymbalta and the Lyrica seem to be working well.  Unfortunately, he has noticed issues with decreasing sensitivity with sexual intercourse and increasing erection problems.  He really saw an issue when he started Lyrica but that improved over time.  The Cymbalta seems to have exacerbated that.  He does not feel that the Lyrica is helping as much with pain control and he is wondering if decreasing the dose of the Lyrica may offset some of the complications in this area.  He is due for a colonoscopy.  After discussing this he elects to do a Cologuard.  He is not yet due for prostate cancer screening.  He declines a flu shot.  He also declines a COVID booster. Past Medical History:  Diagnosis Date   Arthritis    [employer] 04/03/2020   Asthma    Dx'd age 70   Asthma    [employer] 04/03/2020   Migraines    Obesity    Past Surgical History:  Procedure Laterality Date   HEMANGIOMA EXCISION  04/2006   SHOULDER SURGERY Left 2006   Current Outpatient Medications on File Prior to Visit  Medication Sig Dispense Refill   ALPRAZolam (XANAX) 0.5 MG tablet TAKE 1 TABLET BY MOUTH 3 TIMES A DAY AS NEEDED FOR ANXIETY. 30 tablet 0   DULoxetine (CYMBALTA) 60 MG capsule Take 1 capsule (60 mg total) by mouth daily. 30 capsule 3   Glucos-Chondroit-Hyaluron-MSM (GLUCOSAMINE CHONDROITIN JOINT PO) Take by mouth.     hydrochlorothiazide (HYDRODIURIL) 25 MG tablet TAKE (1) TABLET BY MOUTH ONCE DAILY. 90 tablet 0   HYDROcodone-acetaminophen (NORCO/VICODIN)  5-325 MG tablet Take 1 tablet by mouth every 12 (twelve) hours as needed for severe pain. 30 tablet 0    meloxicam (MOBIC) 15 MG tablet TAKE 1 TABLET BY MOUTH ONCE DAILY WITH A MEAL. 30 tablet 0   metoprolol succinate (TOPROL-XL) 50 MG 24 hr tablet TAKE (1) TABLET BY MOUTH ONCE DAILY. (Patient taking differently: Take 50 mg by mouth as needed.) 90 tablet 0   PROAIR HFA 108 (90 Base) MCG/ACT inhaler USE 2 PUFFS EVERY 4 HOURS AS NEEDED. 8.5 g 0   No current facility-administered medications on file prior to visit.   Allergies  Allergen Reactions   Cortisone Other (See Comments)    Hx of retinal detachment   Albuterol     Migraine    Social History   Socioeconomic History   Marital status: Married    Spouse name: Not on file   Number of children: Not on file   Years of education: Not on file   Highest education level: Not on file  Occupational History   Not on file  Tobacco Use   Smoking status: Never   Smokeless tobacco: Never  Vaping Use   Vaping Use: Never used  Substance and Sexual Activity   Alcohol use: Yes    Comment: Rarely   Drug use: No   Sexual activity: Not on file  Other Topics Concern   Not on file  Social History Narrative   Not on file   Social Determinants of Health   Financial Resource Strain: Not on file  Food Insecurity: Not on file  Transportation Needs: Not on file  Physical Activity: Not on file  Stress: Not on file  Social Connections: Not on file  Intimate Partner Violence: Not on file     Review of Systems  All other systems reviewed and are negative.     Objective:   Physical Exam Vitals reviewed.  Constitutional:      General: He is not in acute distress.    Appearance: Normal appearance. He is well-developed. He is obese. He is not ill-appearing, toxic-appearing or diaphoretic.  HENT:     Head: Normocephalic and atraumatic.     Right Ear: Tympanic membrane and ear canal normal. There is no impacted cerumen.     Left Ear: Tympanic membrane and ear canal normal. There is no impacted cerumen.     Nose: Nose normal. No congestion or  rhinorrhea.     Mouth/Throat:     Mouth: Mucous membranes are moist.     Pharynx: No oropharyngeal exudate or posterior oropharyngeal erythema.  Eyes:     Extraocular Movements: Extraocular movements intact.     Conjunctiva/sclera: Conjunctivae normal.     Pupils: Pupils are equal, round, and reactive to light.  Neck:     Vascular: No carotid bruit.  Cardiovascular:     Rate and Rhythm: Normal rate and regular rhythm.     Pulses: Normal pulses.     Heart sounds: Normal heart sounds. No murmur heard.   No friction rub. No gallop.  Pulmonary:     Effort: Pulmonary effort is normal. No respiratory distress.     Breath sounds: Normal breath sounds. No stridor. No wheezing, rhonchi or rales.  Chest:     Chest wall: No tenderness.  Abdominal:     General: Abdomen is flat. Bowel sounds are normal. There is no distension.     Palpations: Abdomen is soft. There is no mass.     Tenderness: There is no  abdominal tenderness. There is no guarding or rebound.     Hernia: No hernia is present.  Musculoskeletal:     Cervical back: Normal range of motion and neck supple. No rigidity or tenderness.  Lymphadenopathy:     Cervical: No cervical adenopathy.  Skin:    Coloration: Skin is not jaundiced.     Findings: No erythema or rash.  Neurological:     General: No focal deficit present.     Mental Status: He is alert and oriented to person, place, and time. Mental status is at baseline.     Cranial Nerves: No cranial nerve deficit.     Sensory: No sensory deficit.     Motor: No weakness.     Coordination: Coordination normal.     Gait: Gait normal.     Deep Tendon Reflexes: Reflexes normal.  Psychiatric:        Mood and Affect: Mood normal.        Behavior: Behavior normal.        Thought Content: Thought content normal.        Judgment: Judgment normal.          Assessment & Plan:  Special screening for malignant neoplasms, colon - Plan: Cologuard  Screening for malignant neoplasm  of the rectum - Plan: Cologuard  Screening cholesterol level - Plan: COMPLETE METABOLIC PANEL WITH GFR, Lipid panel  Benign essential HTN  Fibromyalgia  Pure hypercholesterolemia  Obesity (BMI 30-39.9) Regarding his physical exam, I recommended a flu shot which she politely declined.  Also recommended a COVID booster.  Regarding his cancer screening, he elects to perform Cologuard testing.  He is not yet due for PSA.  Blood pressure today is well controlled.  At his last lab work, a random blood sugar was 118.  He is fasting this morning to check his cholesterol so I will repeat that.  If persistently elevated we may need to add a hemoglobin A16c as his 53 year old brother has recently been diagnosed with type 2 diabetes.  Regarding the fibromyalgia I recommended decreasing the Lyrica to 50 mg twice a day and continuing the Cymbalta at 60 mg a day.  If that does not help with the issues regarding erectile dysfunction, we can try decreasing the dose of Cymbalta and also adding Viagra 50 to 100 mg p.o. as needed.

## 2021-04-20 ENCOUNTER — Other Ambulatory Visit: Payer: Self-pay | Admitting: Nurse Practitioner

## 2021-04-20 ENCOUNTER — Other Ambulatory Visit: Payer: Self-pay | Admitting: Family Medicine

## 2021-05-11 ENCOUNTER — Encounter: Payer: BC Managed Care – PPO | Admitting: Physical Medicine and Rehabilitation

## 2021-05-16 ENCOUNTER — Other Ambulatory Visit: Payer: Self-pay | Admitting: Family Medicine

## 2021-05-28 ENCOUNTER — Encounter: Payer: Self-pay | Admitting: Family Medicine

## 2021-06-04 ENCOUNTER — Encounter: Payer: BC Managed Care – PPO | Admitting: Physical Medicine and Rehabilitation

## 2021-06-05 ENCOUNTER — Other Ambulatory Visit: Payer: Self-pay

## 2021-06-05 ENCOUNTER — Encounter: Payer: Self-pay | Admitting: Podiatry

## 2021-06-05 ENCOUNTER — Ambulatory Visit: Payer: BC Managed Care – PPO | Admitting: Podiatry

## 2021-06-05 DIAGNOSIS — M2042 Other hammer toe(s) (acquired), left foot: Secondary | ICD-10-CM

## 2021-06-05 DIAGNOSIS — M67472 Ganglion, left ankle and foot: Secondary | ICD-10-CM

## 2021-06-05 NOTE — Progress Notes (Signed)
He presents today for follow-up of his painful toe second left.  He states is becoming more more painful as time goes on he would like to consider surgical excision and correction of the toe.  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  Plan: Consented him today for excision of painful lesion as well as a DIPJ arthroplasty.  He understands this and is amenable to it we did discuss the possible postop complications and his possible side effects associated with it.  I will follow-up with him in the near future for surgical intervention.

## 2021-06-08 ENCOUNTER — Telehealth: Payer: Self-pay | Admitting: Urology

## 2021-06-08 NOTE — Telephone Encounter (Signed)
DOS - 06/29/21   EXC MUCOID CYST LEFT --- 10258 MALLET TOE 2ND LEFT --- 52778   [employer] EFFECTIVE DATE - 07/22/20   PLAN DEDUCTIBLE - $1,250.00 W/ $402.47 REMAINING OUT OF POCKET - $4,890.00 W/ $3,210.89 REMAINING COINSURANCE - 20% COPAY - $0.00    NO PRIOR AUTH REQUIRED

## 2021-06-21 HISTORY — PX: TOE SURGERY: SHX1073

## 2021-06-24 ENCOUNTER — Encounter: Payer: Self-pay | Admitting: Family Medicine

## 2021-06-25 ENCOUNTER — Other Ambulatory Visit: Payer: Self-pay | Admitting: Family Medicine

## 2021-06-25 MED ORDER — DULOXETINE HCL 30 MG PO CPEP: 30.0000 mg | ORAL_CAPSULE | Freq: Every day | ORAL | 3 refills | Status: DC

## 2021-06-27 ENCOUNTER — Encounter: Payer: Self-pay | Admitting: Podiatry

## 2021-06-28 ENCOUNTER — Other Ambulatory Visit: Payer: Self-pay | Admitting: Podiatry

## 2021-06-28 MED ORDER — HYDROCODONE-ACETAMINOPHEN 10-325 MG PO TABS: 1.0000 | ORAL_TABLET | Freq: Four times a day (QID) | ORAL | 0 refills | Status: AC | PRN

## 2021-06-28 MED ORDER — CEPHALEXIN 500 MG PO CAPS: 500.0000 mg | ORAL_CAPSULE | Freq: Three times a day (TID) | ORAL | 0 refills | Status: DC

## 2021-06-28 MED ORDER — ONDANSETRON HCL 4 MG PO TABS: 4.0000 mg | ORAL_TABLET | Freq: Three times a day (TID) | ORAL | 0 refills | Status: DC | PRN

## 2021-06-29 DIAGNOSIS — M2042 Other hammer toe(s) (acquired), left foot: Secondary | ICD-10-CM

## 2021-06-29 DIAGNOSIS — M67472 Ganglion, left ankle and foot: Secondary | ICD-10-CM

## 2021-07-05 ENCOUNTER — Ambulatory Visit (INDEPENDENT_AMBULATORY_CARE_PROVIDER_SITE_OTHER): Payer: BC Managed Care – PPO

## 2021-07-05 ENCOUNTER — Ambulatory Visit (INDEPENDENT_AMBULATORY_CARE_PROVIDER_SITE_OTHER): Payer: BC Managed Care – PPO | Admitting: Podiatry

## 2021-07-05 ENCOUNTER — Other Ambulatory Visit: Payer: Self-pay

## 2021-07-05 DIAGNOSIS — M2042 Other hammer toe(s) (acquired), left foot: Secondary | ICD-10-CM

## 2021-07-05 DIAGNOSIS — M67472 Ganglion, left ankle and foot: Secondary | ICD-10-CM

## 2021-07-05 DIAGNOSIS — Z9889 Other specified postprocedural states: Secondary | ICD-10-CM

## 2021-07-07 NOTE — Progress Notes (Signed)
He presents today for his first postop visit date of surgery 06/29/2021 left foot distal arthroplasty with excision of mucoid cyst.  He states that he has been doing well no problems.  Objective: Vital signs stable alert oriented x3 dressed her dressing is intact.  Sutures are intact margins well coapted mild edema distally.  Radiographs taken today demonstrate complete arthroplasty at the DIPJ.  Assessment: Well-healing surgical toe.  Plan: Awaiting pathology.  Redressed the foot today dressed a compressive dressing follow-up with him in 1 to 2 weeks for suture removal.

## 2021-07-10 ENCOUNTER — Telehealth: Payer: Self-pay | Admitting: *Deleted

## 2021-07-10 NOTE — Telephone Encounter (Signed)
[facility](Dr Catalina Lunger), is calling to get an approval from doctor to have dental cleaning since patient recently had surgery.Please fax approval letter to [phone]

## 2021-07-11 NOTE — Telephone Encounter (Addendum)
Faxed letter to [facility] per their request 07/11/21,received confirmation.

## 2021-07-12 ENCOUNTER — Ambulatory Visit (INDEPENDENT_AMBULATORY_CARE_PROVIDER_SITE_OTHER): Payer: BC Managed Care – PPO | Admitting: Podiatry

## 2021-07-12 ENCOUNTER — Encounter: Payer: Self-pay | Admitting: Podiatry

## 2021-07-12 ENCOUNTER — Other Ambulatory Visit: Payer: Self-pay

## 2021-07-12 ENCOUNTER — Other Ambulatory Visit: Payer: Self-pay | Admitting: Family Medicine

## 2021-07-12 DIAGNOSIS — M67472 Ganglion, left ankle and foot: Secondary | ICD-10-CM

## 2021-07-12 DIAGNOSIS — Z9889 Other specified postprocedural states: Secondary | ICD-10-CM

## 2021-07-12 DIAGNOSIS — M2042 Other hammer toe(s) (acquired), left foot: Secondary | ICD-10-CM

## 2021-07-12 NOTE — Progress Notes (Signed)
He presents today for postop visit date of surgery 06/29/2021 left foot excision mucoid cyst and arthroplasty of the DIPJ of the second toe.  Patient complains of some soreness because he is been up on it this week.  But no other complaints.  Objective: Vital signs stable he is alert oriented x3 dressing was removed demonstrates skin is in good approximation and there is no dehiscence of the wound.  Sutures removed today.  Placed a dressing and a compression wrap which I demonstrated to him.  Assessment: Well-healing surgical toe.  Plan: Recommended that he take the toe daily with Coban to help decrease the swelling.

## 2021-07-26 ENCOUNTER — Other Ambulatory Visit: Payer: Self-pay

## 2021-07-26 ENCOUNTER — Ambulatory Visit (INDEPENDENT_AMBULATORY_CARE_PROVIDER_SITE_OTHER): Payer: BC Managed Care – PPO | Admitting: Podiatry

## 2021-07-26 ENCOUNTER — Encounter: Payer: Self-pay | Admitting: Podiatry

## 2021-07-26 DIAGNOSIS — M2042 Other hammer toe(s) (acquired), left foot: Secondary | ICD-10-CM

## 2021-07-26 DIAGNOSIS — M67472 Ganglion, left ankle and foot: Secondary | ICD-10-CM

## 2021-07-26 DIAGNOSIS — Z9889 Other specified postprocedural states: Secondary | ICD-10-CM

## 2021-07-26 NOTE — Progress Notes (Signed)
He presents today for postop visit date of surgery 06/29/2021 excision mucoid cyst and a DIPJ arthroplasty.  Denies fever chills nausea vomiting muscle aches and pains states is doing just fine sometimes it hurts a little bit as it swells.  Objective: Vital signs are stable alert oriented x3 there is no erythematous mild edema no cellulitis drainage or odor incision sites going on to heal uneventfully.  Assessment: Well-healing surgical toe second digit left foot.  Plan: Unguinal follow-up with him in a few weeks but I encouraged him to wrap the toe on a regular basis back into his regular routine.

## 2021-08-09 ENCOUNTER — Ambulatory Visit (INDEPENDENT_AMBULATORY_CARE_PROVIDER_SITE_OTHER): Payer: BC Managed Care – PPO | Admitting: Podiatry

## 2021-08-09 ENCOUNTER — Other Ambulatory Visit: Payer: Self-pay

## 2021-08-09 ENCOUNTER — Encounter: Payer: Self-pay | Admitting: Podiatry

## 2021-08-09 DIAGNOSIS — M2042 Other hammer toe(s) (acquired), left foot: Secondary | ICD-10-CM

## 2021-08-09 DIAGNOSIS — Z9889 Other specified postprocedural states: Secondary | ICD-10-CM

## 2021-08-09 DIAGNOSIS — M67472 Ganglion, left ankle and foot: Secondary | ICD-10-CM

## 2021-08-09 NOTE — Progress Notes (Signed)
He presents today date of surgery 06/29/2021 for excision mucoid cyst and arthroplasty DIPJ second toe of the left foot.  He states that the toe itches and burns occasionally bothers him particularly when he wraps the toe.  Objective: Vital signs are stable he is alert oriented x3-second toe left does demonstrate some lateral deviation of the DIPJ secondary to the hallux leaning on it.  Other than that he does demonstrate what appears to be a possible stitch trying to come through the skin but I did not open that today.  Currently it does not appear to be infected more inflamed than anything else he still has swelling of the toe.  Assessment: Slowly healing DIPJ arthroplasty.  Plan: He is going to follow-up with me in 1 month if necessary.

## 2021-08-10 ENCOUNTER — Other Ambulatory Visit: Payer: Self-pay

## 2021-08-10 ENCOUNTER — Encounter
Payer: BC Managed Care – PPO | Attending: Physical Medicine and Rehabilitation | Admitting: Physical Medicine and Rehabilitation

## 2021-08-10 ENCOUNTER — Other Ambulatory Visit: Payer: Self-pay | Admitting: Family Medicine

## 2021-08-10 ENCOUNTER — Encounter: Payer: Self-pay | Admitting: Physical Medicine and Rehabilitation

## 2021-08-10 VITALS — BP 124/80 | HR 75 | Ht 69.5 in | Wt 240.0 lb

## 2021-08-10 DIAGNOSIS — M170 Bilateral primary osteoarthritis of knee: Secondary | ICD-10-CM | POA: Diagnosis not present

## 2021-08-10 DIAGNOSIS — G894 Chronic pain syndrome: Secondary | ICD-10-CM

## 2021-08-10 DIAGNOSIS — M357 Hypermobility syndrome: Secondary | ICD-10-CM | POA: Insufficient documentation

## 2021-08-10 DIAGNOSIS — E669 Obesity, unspecified: Secondary | ICD-10-CM | POA: Diagnosis present

## 2021-08-10 DIAGNOSIS — Z79899 Other long term (current) drug therapy: Secondary | ICD-10-CM | POA: Diagnosis present

## 2021-08-10 DIAGNOSIS — M797 Fibromyalgia: Secondary | ICD-10-CM | POA: Diagnosis not present

## 2021-08-10 DIAGNOSIS — Z5181 Encounter for therapeutic drug level monitoring: Secondary | ICD-10-CM | POA: Diagnosis not present

## 2021-08-10 MED ORDER — TRAMADOL HCL 50 MG PO TABS
50.0000 mg | ORAL_TABLET | Freq: Four times a day (QID) | ORAL | 0 refills | Status: DC | PRN
Start: 2021-08-10 — End: 2021-08-23

## 2021-08-10 NOTE — Patient Instructions (Addendum)
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 Sent by PCP- Dr Dennard Schaumann.    Went over hypermobility syndrome and what this means for arthritis- early development of DJD.   2.  Con't Duloxetine and Lyrica at current doses due to side effects- don't want to increase doses at this time.    3. Went over options of pain meds- tramadol vs other nerve pain meds. Went over possiblity of opiate contract/UDS etc if start Tramadol.    4. Con't Mobic for now- per PCP.   5. Uses flexeril as needed for muscle spasms of back- cna write for this when needs it- also used Norco as needed rarely.    6. Tramadol-  50 mg q6 hours as needed- a lot of patients use 100 mg /2 tabs 2x/day with good results- and can also take with tylenol- 325 mg with tramadol.   7. UDS and opiate contract- today.   8.  F/u in 3 months- call me in ~ 1 week to discuss how pain - trigger point injections at f/u.  9. Theracane-  hold pressure- do not massage 2-4 minutes on each spot that's painful- minimum of 2 minutes!

## 2021-08-10 NOTE — Progress Notes (Signed)
Subjective:    Patient ID: Stephen Allen, male    DOB: Jul 02, 1976, 46 y.o.   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   Here for evaluation of chronic pain. Sent by PCP- Dr Tanya Nones.    Knees are a little better- getting gel injections in knees- got him off canes- in the middle of series of 3 injections- B/L knees. Last gel injections prior were 9-10 months ago.    Depending on the day- pain moves around the day.   Widespread pain- put on Duloxetine - was on 60 mg daily- reduced to 30 mg daily- and Lyrica 50 mg BID- now- was on 100 mg BID. When first started Lyrica couldn't have any erections.   The most recent reduction in pain meds caused pain to increase somewhat, but still better than it was originally.   Couldn't have orgasm- gave up- so started Viagra and reduced doses of meds.   Also takes Mobic daily- used to live on Ibuprofen, switched a few years ago-   Had surgery on L foot in 12/22- doing well.  L shoulder Bankart lesion/repair- didn't go well - 2004-2005.  Has a lot of arthritis and RTC on R shoulder.    Had fx of distal sacrum last year- 7/22- did it roller skaing with sons; S5 distal sacrum- has to sit on cushion all the time. Cannot get on motorcycle, jet ski, 4 wheeler, anything right now. Advances B/L hip DJD based on sacral xray.     Tried: Never tried trigger point injections Did chiropractor- helped low back pan- but hasn't been done lately.  Never tried tramadol since surgery- and only focused on surgical pain, so cannot say if helped.     Pain has slowed him down a lot and gained weight.  Has been able to lose 15 lbs in last few months due to surgery on L foot. Max weight 250 at home nude in last 6 months.    Social Hx: Professor of Patent attorney at [employer] Not a smoker   Family Hx:- first cousin has RA;  otherwise OA in the family; Brother has celiac disease and new DM- hit at age 20 yr old.       Pain Inventory Average Pain 6 Pain Right Now 1 My pain is intermittent, sharp, dull, stabbing, and aching  In the last 24 hours, has pain interfered with the following? General activity 5 Relation with others 5 Enjoyment of life 5 What TIME of day is your pain at its worst? daytime, evening, and night Sleep (in general) Fair  Pain is worse with: standing and some activites Pain improves with: medication, injections, and ice Relief from Meds: 8  walk without assistance how many minutes can you walk? 30 minutes ability to climb steps?  yes do you drive?  yes Do you have any goals in this area?  yes  employed # of hrs/week 40 hours Professor   numbness tingling confusion anxiety  Any changes since last visit?  yes, [place]  Any changes since last visit?  no    Family History  Problem Relation Age of Onset   Hypertension Father    Asthma Brother    Diabetes Brother    Cancer Paternal Grandmother        Breast   Social History   Socioeconomic  History   Marital status: Married    Spouse name: Not on file   Number of children: Not on file   Years of education: Not on file   Highest education level: Not on file  Occupational History   Not on file  Tobacco Use   Smoking status: Never   Smokeless tobacco: Never  Vaping Use   Vaping Use: Never used  Substance and Sexual Activity   Alcohol use: Yes    Comment: Rarely   Drug use: No   Sexual activity: Not on file  Other Topics Concern   Not on file  Social History Narrative   Not on file   Social Determinants of Health   Financial Resource Strain: Not on file  Food Insecurity: Not on file  Transportation Needs: Not on file  Physical Activity: Not on file  Stress: Not on file  Social Connections: Not on file   Past Surgical History:  Procedure Laterality Date   HEMANGIOMA EXCISION  04/21/2006    SHOULDER SURGERY Left 07/22/2004   TOE SURGERY Left 06/2021   [facility] (2nd toe)   Past Medical History:  Diagnosis Date   Arthritis    [employer]ia 04/03/2020   Asthma    Dx'd age 59   Asthma    [employer]ia 04/03/2020   Migraines    Obesity    BP 124/80    Pulse 75    Ht 5' 9.5" (1.765 m)    Wt 240 lb (108.9 kg)    SpO2 96%    BMI 34.93 kg/m   Opioid Risk Score:   Fall Risk Score:  `1  Depression screen PHQ 2/9  Depression screen [facility] 2/9 08/10/2021 04/06/2021 04/03/2020 08/06/2018 09/27/2016  Decreased Interest 0 0 0 1 0  Down, Depressed, Hopeless 0 0 0 0 0  PHQ - 2 Score 0 0 0 1 0  Altered sleeping 2 - - 2 0  Tired, decreased energy 1 - - 2 0  Change in appetite 0 - - 0 0  Feeling bad or failure about yourself  0 - - 0 0  Trouble concentrating 0 - - 0 0  Moving slowly or fidgety/restless 0 - - 0 0  Suicidal thoughts 0 - - 0 0  PHQ-9 Score 3 - - 5 0  Difficult doing work/chores - - - Not difficult at all -    Review of Systems  Musculoskeletal:  Positive for arthralgias, back pain and gait problem.       Pain in both knees, both lower legs, hips & shoulders  All other systems reviewed and are negative.     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 Sent by PCP- Dr Tanya Nones.    Went over  hypermobility syndrome and what this means for arthritis- early development of DJD.   2.  Con't Duloxetine and Lyrica at current doses due to side effects- don't want to increase doses at this time.    3. Went over options of pain meds- tramadol vs other nerve pain meds. Went over possiblity of opiate contract/UDS etc if start Tramadol.    4. Con't Mobic for now- per PCP.   5. Uses flexeril as needed for muscle spasms of back- cna write for this when needs it- also used Norco as needed rarely.    6. Tramadol-  50 mg q6 hours as needed- a lot of patients use 100 mg /2 tabs 2x/day with good results- and can also take with tylenol- 325 mg with tramadol.   7. UDS and opiate contract- today.   8.  F/u in 3 months- call me in ~ 1 week to discuss how pain  Wait on trigger point injections until f/u.   9. Theracane-  hold pressure- do not massage 2-4 minutes on each spot that's painful- minimum of 2 minutes!  I spent a total of 46 minutes on visit- going over hypermobility syndrome; as well as theracane and pain med options.

## 2021-08-10 NOTE — Addendum Note (Signed)
Addended by: Becky Sax on: 08/10/2021 12:13 PM   Modules accepted: Orders

## 2021-08-16 LAB — TOXASSURE SELECT,+ANTIDEPR,UR

## 2021-08-20 ENCOUNTER — Other Ambulatory Visit: Payer: Self-pay | Admitting: Family Medicine

## 2021-08-20 NOTE — Telephone Encounter (Signed)
Lyrica refill request.  Last seen 04/06/2021, last filled 04/06/2021.

## 2021-08-21 ENCOUNTER — Telehealth: Payer: Self-pay | Admitting: *Deleted

## 2021-08-21 NOTE — Telephone Encounter (Signed)
Urine drug screen for this encounter is NEGATIVE  for medication. This would be expected as last RX was 06/28/21 #28.

## 2021-08-22 ENCOUNTER — Other Ambulatory Visit: Payer: Self-pay | Admitting: Nurse Practitioner

## 2021-08-23 ENCOUNTER — Encounter: Payer: Self-pay | Admitting: Physical Medicine and Rehabilitation

## 2021-08-23 MED ORDER — TRAMADOL HCL 50 MG PO TABS: 50.0000 mg | ORAL_TABLET | Freq: Four times a day (QID) | ORAL | 5 refills | Status: DC | PRN

## 2021-08-23 NOTE — Telephone Encounter (Signed)
Xanax refill request.  Last seen 04/06/2021, last filled 12/29/2020.

## 2021-09-10 ENCOUNTER — Other Ambulatory Visit: Payer: Self-pay | Admitting: Family Medicine

## 2021-09-13 ENCOUNTER — Encounter: Payer: BC Managed Care – PPO | Admitting: Podiatry

## 2021-09-14 ENCOUNTER — Other Ambulatory Visit: Payer: Self-pay | Admitting: Family Medicine

## 2021-09-14 NOTE — Telephone Encounter (Signed)
LOV 04/06/21 Last refill 08/20/21, #60, 0 refills  Please review, thanks!

## 2021-10-02 ENCOUNTER — Ambulatory Visit: Payer: BC Managed Care – PPO

## 2021-10-02 ENCOUNTER — Encounter: Payer: Self-pay | Admitting: Podiatry

## 2021-10-02 ENCOUNTER — Other Ambulatory Visit: Payer: Self-pay

## 2021-10-02 ENCOUNTER — Ambulatory Visit (INDEPENDENT_AMBULATORY_CARE_PROVIDER_SITE_OTHER): Payer: BC Managed Care – PPO | Admitting: Podiatry

## 2021-10-02 DIAGNOSIS — M2042 Other hammer toe(s) (acquired), left foot: Secondary | ICD-10-CM

## 2021-10-02 DIAGNOSIS — M67472 Ganglion, left ankle and foot: Secondary | ICD-10-CM

## 2021-10-02 DIAGNOSIS — Z9889 Other specified postprocedural states: Secondary | ICD-10-CM

## 2021-10-02 NOTE — Progress Notes (Signed)
Patient seen for evaluation for custom foot orthotics. Discussed financial responsibility and patient requested a pre-certification. Patient's insurance to be contacted to determine out-of-pocket estimation. All questions answered and concerns addressed. ?

## 2021-10-02 NOTE — Progress Notes (Signed)
He presents today for follow-up of his DIPJ arthroplasty date of surgery 06/29/2021 left foot.  He states that the toe gets really swollen and painful by the end of the day.  He states that he has not been wrapping it because it seems to make it worse when he wraps it.  He is also complaining of painful knees and hips and ankles as well as feet bilaterally states that this been going on since his childhood. ? ? ? ?Objective: Objective: Vital signs are stable he is alert and oriented x3 his second toe does demonstrate some scar tissue at the level of the DIPJ however it appears to be relatively normal though the scar tissue is somewhat thick it is noticeable with scar tissue does extend into the arthroplasty.  So I think physical therapy may be best to help break that up. ? ?He does have pain in the forefoot along the medial longitudinal arch and the heel he also has some tenderness of the tendons along medial lateral ankle. ? ?Assessment: Capsulitis metatarsalgia Planter fasciitis by history and exam. ? ?Painful second toe left foot DIPJ. ? ?Plan: Recommend slight him see Arlys John today to see about getting a set of orthotics made it may help realign his feet ankles and knees.  Also going to send him to physical therapy to help break down the scar tissue. ?

## 2021-10-18 ENCOUNTER — Other Ambulatory Visit: Payer: Self-pay | Admitting: Family Medicine

## 2021-10-19 MED ORDER — PREGABALIN 50 MG PO CAPS: 50.0000 mg | ORAL_CAPSULE | Freq: Two times a day (BID) | ORAL | 2 refills | Status: DC

## 2021-10-19 NOTE — Addendum Note (Signed)
Addended by: Dorna Mai R on: 10/19/2021 11:15 AM ? ? Modules accepted: Orders ? ?

## 2021-10-19 NOTE — Telephone Encounter (Signed)
LOV 04/06/21 ?Last refill 09/14/21, #60, 0 refills ?  ?Please review, thanks! ?

## 2021-10-20 ENCOUNTER — Other Ambulatory Visit: Payer: Self-pay | Admitting: Family Medicine

## 2021-11-09 ENCOUNTER — Encounter: Payer: Self-pay | Admitting: Physical Medicine and Rehabilitation

## 2021-11-09 ENCOUNTER — Encounter
Payer: BC Managed Care – PPO | Attending: Physical Medicine and Rehabilitation | Admitting: Physical Medicine and Rehabilitation

## 2021-11-09 VITALS — BP 134/78 | HR 81 | Ht 69.5 in | Wt 239.0 lb

## 2021-11-09 DIAGNOSIS — M797 Fibromyalgia: Secondary | ICD-10-CM | POA: Insufficient documentation

## 2021-11-09 DIAGNOSIS — M357 Hypermobility syndrome: Secondary | ICD-10-CM | POA: Diagnosis present

## 2021-11-09 DIAGNOSIS — M7918 Myalgia, other site: Secondary | ICD-10-CM | POA: Insufficient documentation

## 2021-11-09 DIAGNOSIS — M170 Bilateral primary osteoarthritis of knee: Secondary | ICD-10-CM | POA: Insufficient documentation

## 2021-11-09 NOTE — Patient Instructions (Signed)
Plan: ?Patient here for trigger point injections for ? Consent done and on chart. ? ?Cleaned areas with alcohol and injected using a 27 gauge 1.5 inch needle ? ?Injected 4cc ?Using 1% Lidocaine with no EPI ? ?Upper traps B/L  ?Levators B/L  ?Posterior scalenes- L only ?Middle scalenes ?Splenius Capitus- B/L  ?Pectoralis Major ?Rhomboids- B/L  ?Infraspinatus ?Teres Major/minor ?Thoracic paraspinals- B/L  ?Lumbar paraspinals- B/L  ?Other injections- B/L lateral gastrocs ? ? ?Patient's level of pain prior was 3/10 ?Current level of pain after injections is- ~ 3/10 ? ?There was no bleeding or complications. ? ?Patient was advised to drink a lot of water on day after injections to flush system ?Will have increased soreness for 12-48 hours after injections.  ?Can use Lidocaine patches the day AFTER injections ?Can use theracane on day of injections in places didn't inject ?Can use heating pad 4-6 hours AFTER injections  ? ?2. Con't meds- including tramadol- has refills ? ?3. Con't Lyrica/Duloxetine ? ?4. Use Rolling pin for Calves trigger points ? ?5. Use theracane- use 2x/week- to MAINTAIN muscle relaxation.  ? ? ?6. F/U - 3 months- for TrP injections and f/u on hypermobility syndrome./chronic pain.   ?

## 2021-11-09 NOTE — Progress Notes (Signed)
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 Hx of dislocation of knees and shoulders B/L . ? ? ?Things better as long as takes meds.  ?Adding Tramadol has helped.  ?If takes anything out of mix- like Mobic-hurts more.  ? ?Takes Flexeril very rarely.  ?On Duloxetine/Lyrica for nerve pain.  ? ? ? ? ?Plan: ?Patient here for trigger point injections for ? Consent done and on chart. ? ?Cleaned areas with alcohol and injected using a 27 gauge 1.5 inch needle ? ?Injected 4cc ?Using 1% Lidocaine with no EPI ? ?Upper traps B/L  ?Levators B/L  ?Posterior scalenes- L only ?Middle scalenes ?Splenius Capitus- B/L  ?Pectoralis Major ?Rhomboids- B/L  ?Infraspinatus ?Teres Major/minor ?Thoracic paraspinals- B/L  ?Lumbar paraspinals- B/L  ?Other injections- B/L lateral gastrocs ? ? ?Patient's level of pain prior was 3/10 ?Current level of pain after injections is- ~ 3/10 ? ?There was no bleeding or complications. ? ?Patient was advised to drink a lot of water on day after injections to flush system ?Will have increased soreness for 12-48 hours after injections.  ?Can use Lidocaine patches the day AFTER injections ?Can use theracane on day of injections in places didn't inject ?Can use heating pad 4-6 hours AFTER injections  ? ?2. Con't meds- including tramadol- has refills ? ?3. Con't Lyrica/Duloxetine ? ?4. Use Rolling pin for Calves trigger points ? ?5. Use theracane- use 2x/week- to MAINTAIN muscle relaxation.  ? ? ?6. F/U - 3 months- for TrP injections and f/u on hypermobility syndrome./chronic pain.   ?

## 2021-11-13 ENCOUNTER — Ambulatory Visit
Admission: RE | Admit: 2021-11-13 | Discharge: 2021-11-13 | Disposition: A | Discharge status: Home / Self Care | Payer: BC Managed Care – PPO | Source: Ambulatory Visit | Attending: Family Medicine | Admitting: Family Medicine

## 2021-11-13 ENCOUNTER — Ambulatory Visit: Payer: BC Managed Care – PPO | Admitting: Family Medicine

## 2021-11-13 VITALS — BP 126/76 | HR 86 | Temp 97.2°F | Ht 69.0 in | Wt 241.5 lb

## 2021-11-13 DIAGNOSIS — M533 Sacrococcygeal disorders, not elsewhere classified: Secondary | ICD-10-CM

## 2021-11-13 DIAGNOSIS — Z1211 Encounter for screening for malignant neoplasm of colon: Secondary | ICD-10-CM | POA: Diagnosis not present

## 2021-11-13 DIAGNOSIS — R109 Unspecified abdominal pain: Secondary | ICD-10-CM

## 2021-11-13 NOTE — Progress Notes (Signed)
? ?Subjective:  ? ? Patient ID: Stephen Allen, male    DOB: 25-Dec-1975, 46 y.o.   MRN: [id number] ? ? ?Patient is a very pleasant 46 year old Caucasian gentleman who presents today with a few concerns.  Recently had pain in his right flank.  We have talked in the past about meloxicam possibly causing renal damage and he was concerned that maybe a result of the meloxicam.  Therefore he discontinued the medicine and pain gradually went away.  He denies any hematuria or dysuria urgency or frequency.  The pain is located around the level of L3-L4 but far to the right side.  He was wanting to check his kidney function.  He also recently lost a friend to colon cancer and he would like a referral for colonoscopy.  He also fractured his tailbone/sacrum in July of last year.  He is continuing to have significant pain in his tailbone area and he was questioning if the fracture had healed.  Fracture should have long ago healed at this point unless there is a malunion.  Certainly if the pain is still severe I feel that the patient warrants imaging to see if there is a nonunion. ?Past Medical History:  ?Diagnosis Date  ? Arthritis   ? [employer] 04/03/2020  ? Asthma   ? Dx'd age 46  ? Asthma   ? [employer] 04/03/2020  ? Migraines   ? Obesity   ? ?Past Surgical History:  ?Procedure Laterality Date  ? HEMANGIOMA EXCISION  04/21/2006  ? SHOULDER SURGERY Left 07/22/2004  ? TOE SURGERY Left 06/2021  ? [facility] (2nd toe)  ? ?Current Outpatient Medications on File Prior to Visit  ?Medication Sig Dispense Refill  ? ALPRAZolam (XANAX) 0.5 MG tablet TAKE 1 TABLET BY MOUTH 3 TIMES A DAY AS NEEDED FOR ANXIETY. 30 tablet 0  ? DULoxetine (CYMBALTA) 30 MG capsule Take 1 capsule (30 mg total) by mouth daily. 90 capsule 3  ? Glucos-Chondroit-Hyaluron-MSM (GLUCOSAMINE CHONDROITIN JOINT PO) Take by mouth.    ? hydrochlorothiazide (HYDRODIURIL) 25 MG tablet TAKE (1) TABLET BY MOUTH ONCE DAILY. 90 tablet 0  ? HYDROcodone-acetaminophen (NORCO)  10-325 MG tablet hydrocodone 10 mg-acetaminophen 325 mg tablet    ? meloxicam (MOBIC) 15 MG tablet TAKE 1 TABLET BY MOUTH ONCE DAILY WITH A MEAL. 30 tablet 2  ? metoprolol succinate (TOPROL-XL) 50 MG 24 hr tablet TAKE (1) TABLET BY MOUTH ONCE DAILY. (Patient taking differently: Take 50 mg by mouth as needed.) 90 tablet 0  ? pregabalin (LYRICA) 50 MG capsule Take 1 capsule (50 mg total) by mouth 2 (two) times daily. 60 capsule 2  ? PROAIR HFA 108 (90 Base) MCG/ACT inhaler USE 2 PUFFS EVERY 4 HOURS AS NEEDED. (Patient not taking: Reported on 08/10/2021) 8.5 g 0  ? sildenafil (VIAGRA) 100 MG tablet Take 0.5-1 tablets (50-100 mg total) by mouth daily as needed for erectile dysfunction. 5 tablet 11  ? traMADol (ULTRAM) 50 MG tablet Take 1 tablet (50 mg total) by mouth every 6 (six) hours as needed. 120 tablet 5  ? ?No current facility-administered medications on file prior to visit.  ? ?Allergies  ?Allergen Reactions  ? Cortisone Other (See Comments)  ?  Hx of retinal detachment  ? Albuterol   ?  Migraine ?  ? ?Social History  ? ?Socioeconomic History  ? Marital status: Married  ?  Spouse name: Not on file  ? Number of children: Not on file  ? Years of education: Not  on file  ? Highest education level: Not on file  ?Occupational History  ? Not on file  ?Tobacco Use  ? Smoking status: Never  ? Smokeless tobacco: Never  ?Vaping Use  ? Vaping Use: Never used  ?Substance and Sexual Activity  ? Alcohol use: Yes  ?  Comment: Rarely  ? Drug use: No  ? Sexual activity: Not on file  ?Other Topics Concern  ? Not on file  ?Social History Narrative  ? Not on file  ? ?Social Determinants of Health  ? ?Financial Resource Strain: Not on file  ?Food Insecurity: Not on file  ?Transportation Needs: Not on file  ?Physical Activity: Not on file  ?Stress: Not on file  ?Social Connections: Not on file  ?Intimate Partner Violence: Not on file  ? ? ? ?Review of Systems  ?All other systems reviewed and are negative. ? ?   ?Objective:  ? Physical  Exam ?Vitals reviewed.  ?Constitutional:   ?   General: He is not in acute distress. ?   Appearance: Normal appearance. He is well-developed. He is obese. He is not diaphoretic.  ?Cardiovascular:  ?   Rate and Rhythm: Normal rate and regular rhythm.  ?   Heart sounds: Normal heart sounds. No murmur heard. ?  No friction rub. No gallop.  ?Pulmonary:  ?   Effort: Pulmonary effort is normal.  ?   Breath sounds: Normal breath sounds.  ?Lymphadenopathy:  ?   Cervical: No cervical adenopathy.  ?Neurological:  ?   Mental Status: He is alert.  ? ? ? ? ? ?   ?Assessment & Plan:  ?Right flank pain - Plan: Urinalysis, Routine w reflex microscopic, BASIC METABOLIC PANEL WITH GFR ? ?Colon cancer screening - Plan: Ambulatory referral to Gastroenterology ? ?Coccydynia - Plan: DG Sacrum/Coccyx ?Today's visit was spent mainly in discussion regarding his issues.   ?First, I do not feel that the flank pain is due to meloxicam.  I suspect it is most likely musculoskeletal and a result of his fibromyalgia/myofascial pain syndrome.  However I will check a urinalysis to evaluate for any hematuria and I will also check his renal function given the fact he takes meloxicam regularly.  Fortunately the pain has resolved so if the urinalysis is normal, I feel no further work-up is necessary.  Second I will be happy to refer the patient to a gastroenterologist for colonoscopy.  Third, I feel that the patient would benefit from getting an x-ray of the sacrum to see if the fracture has healed.  If it has not healed at this point, I feel that it represents a nonunion and given the significant pain I will consult orthopedics to see what other options the patient has given the impact is having on his quality of life. ?

## 2021-11-14 LAB — URINALYSIS, ROUTINE W REFLEX MICROSCOPIC
Bilirubin Urine: NEGATIVE
Glucose, UA: NEGATIVE
Hgb urine dipstick: NEGATIVE
Ketones, ur: NEGATIVE
Leukocytes,Ua: NEGATIVE
Nitrite: NEGATIVE
Protein, ur: NEGATIVE
Specific Gravity, Urine: 1.008 (ref 1.001–1.035)
pH: 5.5 (ref 5.0–8.0)

## 2021-11-14 LAB — BASIC METABOLIC PANEL WITH GFR
BUN: 11 mg/dL (ref 7–25)
CO2: 29 mmol/L (ref 20–32)
Calcium: 9.4 mg/dL (ref 8.6–10.3)
Chloride: 101 mmol/L (ref 98–110)
Creat: 0.95 mg/dL (ref 0.60–1.29)
Glucose, Bld: 126 mg/dL — ABNORMAL HIGH (ref 65–99)
Potassium: 3.7 mmol/L (ref 3.5–5.3)
Sodium: 140 mmol/L (ref 135–146)
eGFR: 100 mL/min/{1.73_m2} (ref >=60)

## 2021-11-15 ENCOUNTER — Encounter (INDEPENDENT_AMBULATORY_CARE_PROVIDER_SITE_OTHER): Payer: Self-pay | Admitting: *Deleted

## 2021-11-16 ENCOUNTER — Encounter: Payer: Self-pay | Admitting: Family Medicine

## 2022-01-08 ENCOUNTER — Ambulatory Visit: Payer: BC Managed Care – PPO

## 2022-01-08 DIAGNOSIS — M2042 Other hammer toe(s) (acquired), left foot: Secondary | ICD-10-CM

## 2022-01-08 NOTE — Progress Notes (Signed)
SITUATION Reason for Consult: Evaluation for Bilateral Custom Foot Orthoses Patient / Caregiver Report: Patient is ready for foot orthotics  OBJECTIVE DATA: Patient History / Diagnosis:  No diagnosis found.  Current or Previous Devices:   none  Foot Examination: Skin presentation:   intact Ulcers & Callousing:   none Toe / Foot Deformities:  none Weight Bearing Presentation:  rectus Sensation:    intact  Shoe Size:    10.5  ORTHOTIC RECOMMENDATION Recommended Device: 1x pair of custom functional foot orthotics  GOALS OF ORTHOSES - Reduce Pain - Prevent Foot Deformity - Prevent Progression of Further Foot Deformity - Relieve Pressure - Improve the Overall Biomechanical Function of the Foot and Lower Extremity.  ACTIONS PERFORMED Potential out of pocket cost was communicated to patient. Patient understood and consent to casting. Patient was casted for Foot Orthoses via crush box. Procedure was explained and patient tolerated procedure well. Casts were shipped to central fabrication. All questions were answered and concerns addressed.  PLAN Patient is to be called for fitting when devices are ready.

## 2022-01-16 ENCOUNTER — Other Ambulatory Visit: Payer: Self-pay | Admitting: Family Medicine

## 2022-01-16 NOTE — Telephone Encounter (Signed)
LOV 11/13/21 Last refill 10/19/21, #60, 0 refills  Please review, thanks!

## 2022-02-06 ENCOUNTER — Encounter: Payer: Self-pay | Admitting: Family Medicine

## 2022-02-13 ENCOUNTER — Other Ambulatory Visit: Payer: Self-pay | Admitting: Family Medicine

## 2022-02-13 NOTE — Telephone Encounter (Signed)
LOV 11/13/21 Last refill 01/17/22, #60, 0 refills  Please review, thanks!

## 2022-02-19 ENCOUNTER — Ambulatory Visit (INDEPENDENT_AMBULATORY_CARE_PROVIDER_SITE_OTHER): Payer: BC Managed Care – PPO

## 2022-02-19 DIAGNOSIS — M2042 Other hammer toe(s) (acquired), left foot: Secondary | ICD-10-CM

## 2022-02-20 ENCOUNTER — Encounter: Payer: BC Managed Care – PPO | Admitting: Physical Medicine and Rehabilitation

## 2022-02-20 NOTE — Progress Notes (Signed)
Patient presents today to pick up custom molded foot orthotics, diagnosed with hammer toe by Dr. Al Corpus.   Orthotics were dispensed and fit was satisfactory. Reviewed instructions for break-in and wear. Written instructions given to patient.  Patient will follow up as needed.   [facility] - order #  [id number]

## 2022-02-25 ENCOUNTER — Ambulatory Visit: Payer: BC Managed Care – PPO | Admitting: Family Medicine

## 2022-02-25 VITALS — BP 132/76 | HR 78 | Temp 98.0°F | Ht 69.0 in | Wt 233.8 lb

## 2022-02-25 DIAGNOSIS — G5603 Carpal tunnel syndrome, bilateral upper limbs: Secondary | ICD-10-CM | POA: Diagnosis not present

## 2022-02-25 NOTE — Progress Notes (Signed)
Subjective:    Patient ID: Stephen Allen, male    DOB: 26-Jul-1975, 46 y.o.   MRN: [id number]   The last 2 to 3 weeks, the patient has been getting intermittent numbness in both hands left greater than right.  This typically occurs at night.  He will wake up in both hands will be numb.  If he shakes his hand the sensation will come back quickly.  He also has some pain radiating up his left arm towards the elbow.  There is no pain radiating up his right arm.  He denies any severe neck pain or weakness in his arms.  There is no shooting pain going down the arm.  He has a positive Tinel's sign in his left hand.  He has a positive Phalen sign in his left hand muscle strength is 5 out of 5 equal and symmetric in both arms.  He has normal reflexes and normal sensation in both arms. Past Medical History:  Diagnosis Date   Arthritis    Phreesia 04/03/2020   Asthma    Dx'd age 46   Asthma    Phreesia 04/03/2020   Migraines    Obesity    Past Surgical History:  Procedure Laterality Date   HEMANGIOMA EXCISION  04/21/2006   SHOULDER SURGERY Left 07/22/2004   TOE SURGERY Left 06/2021   [facility] (2nd toe)   Current Outpatient Medications on File Prior to Visit  Medication Sig Dispense Refill   ALPRAZolam (XANAX) 0.5 MG tablet TAKE 1 TABLET BY MOUTH 3 TIMES A DAY AS NEEDED FOR ANXIETY. 30 tablet 0   DULoxetine (CYMBALTA) 30 MG capsule Take 1 capsule (30 mg total) by mouth daily. 90 capsule 3   Glucos-Chondroit-Hyaluron-MSM (GLUCOSAMINE CHONDROITIN JOINT PO) Take by mouth.     hydrochlorothiazide (HYDRODIURIL) 25 MG tablet TAKE (1) TABLET BY MOUTH ONCE DAILY. 90 tablet 0   HYDROcodone-acetaminophen (NORCO) 10-325 MG tablet hydrocodone 10 mg-acetaminophen 325 mg tablet     meloxicam (MOBIC) 15 MG tablet TAKE 1 TABLET BY MOUTH ONCE DAILY WITH A MEAL. 30 tablet 0   metoprolol succinate (TOPROL-XL) 50 MG 24 hr tablet TAKE (1) TABLET BY MOUTH ONCE DAILY. (Patient taking differently: Take 50 mg  by mouth as needed.) 90 tablet 0   pregabalin (LYRICA) 50 MG capsule TAKE (1) CAPSULE BY MOUTH TWICE DAILY. 60 capsule 0   sildenafil (VIAGRA) 100 MG tablet Take 0.5-1 tablets (50-100 mg total) by mouth daily as needed for erectile dysfunction. 5 tablet 11   traMADol (ULTRAM) 50 MG tablet Take 1 tablet (50 mg total) by mouth every 6 (six) hours as needed. 120 tablet 5   No current facility-administered medications on file prior to visit.   Allergies  Allergen Reactions   Cortisone Other (See Comments)    Hx of retinal detachment   Albuterol     Migraine    Social History   Socioeconomic History   Marital status: Married    Spouse name: Not on file   Number of children: Not on file   Years of education: Not on file   Highest education level: Not on file  Occupational History   Not on file  Tobacco Use   Smoking status: Never   Smokeless tobacco: Never  Vaping Use   Vaping Use: Never used  Substance and Sexual Activity   Alcohol use: Yes    Comment: Rarely   Drug use: No   Sexual activity: Not on file  Other Topics  Concern   Not on file  Social History Narrative   Not on file   Social Determinants of Health   Financial Resource Strain: Not on file  Food Insecurity: Not on file  Transportation Needs: Not on file  Physical Activity: Not on file  Stress: Not on file  Social Connections: Not on file  Intimate Partner Violence: Not on file     Review of Systems  All other systems reviewed and are negative.      Objective:   Physical Exam Vitals reviewed.  Constitutional:      General: He is not in acute distress.    Appearance: Normal appearance. He is well-developed. He is obese. He is not diaphoretic.  Cardiovascular:     Rate and Rhythm: Normal rate and regular rhythm.     Heart sounds: Normal heart sounds. No murmur heard.    No friction rub. No gallop.  Pulmonary:     Effort: Pulmonary effort is normal.     Breath sounds: Normal breath sounds.   Lymphadenopathy:     Cervical: No cervical adenopathy.  Neurological:     General: No focal deficit present.     Mental Status: He is alert and oriented to person, place, and time. Mental status is at baseline.     Cranial Nerves: No cranial nerve deficit.     Sensory: No sensory deficit.     Motor: No weakness.     Coordination: Coordination normal.     Gait: Gait normal.     Deep Tendon Reflexes: Reflexes normal.           Assessment & Plan:  Bilateral carpal tunnel syndrome Based on his story, I feel that the symptoms are most likely due to bilateral carpal tunnel syndrome.  I explained to the patient while I do not feel a lesion inside the brain would give him numbness in both hands that comes and goes primarily at night while he sleeping as this would affect to different sides of the brain simultaneously.  For similar lesion I do not feel that this would be a lesion in the spinal cord as I feel that the symptoms will be much worse and intermittent numbness if there was a central lesion pressing against the cervical spine.  Therefore I feel that this is a peripheral neuropathy and most consistent with carpal tunnel.  We discussed a referral for nerve conduction studies.  At the present time the patient is comfortable just watching the situation.  If worsening we can get a nerve conduction study

## 2022-03-13 ENCOUNTER — Other Ambulatory Visit: Payer: Self-pay | Admitting: Family Medicine

## 2022-03-13 NOTE — Telephone Encounter (Signed)
Requested medications are due for refill today.  Unsure  Requested medications are on the active medications list.  yes  Last refill. 02/14/2022 #60 0 refills  Future visit scheduled.   no  Notes to clinic.  Refill is not delegated.    Requested Prescriptions  Pending Prescriptions Disp Refills   pregabalin (LYRICA) 50 MG capsule [Pharmacy Med Name: PREGABALIN 50 MG CAPSULE] 60 capsule 0    Sig: TAKE (1) CAPSULE BY MOUTH TWICE DAILY.     Not Delegated - Neurology:  Anticonvulsants - Controlled - pregabalin Failed - 03/13/2022 11:11 AM      Failed - This refill cannot be delegated      Passed - Cr in normal range and within 360 days    Creat  Date Value Ref Range Status  11/13/2021 0.95 0.60 - 1.29 mg/dL Final         Passed - Completed PHQ-2 or PHQ-9 in the last 360 days      Passed - Valid encounter within last 12 months    Recent Outpatient Visits           4 months ago Right flank pain   [place] [facility] Schaumann, Cammie Mcgee, MD   11 months ago Special screening for malignant neoplasms, colon   [place] Pickard, Cammie Mcgee, MD   1 year ago Fatigue, unspecified type   [place] Pickard, Cammie Mcgee, MD   1 year ago Pure hypercholesterolemia   [place] Pickard, Cammie Mcgee, MD   2 years ago [place] Pickard, Cammie Mcgee, MD              Signed Prescriptions Disp Refills   meloxicam (MOBIC) 15 MG tablet 30 tablet 0    Sig: TAKE 1 TABLET BY MOUTH ONCE DAILY WITH A MEAL.     Analgesics:  COX2 Inhibitors Failed - 03/13/2022 11:11 AM      Failed - Manual Review: Labs are only required if the patient has taken medication for more than 8 weeks.      Failed - HGB in normal range and within 360 days    Hemoglobin  Date Value Ref Range Status  02/19/2021 14.6 13.2 - 17.1 g/dL Final         Failed - HCT in normal range and within 360 days    HCT  Date Value Ref Range Status   02/19/2021 44.4 38.5 - 50.0 % Final         Passed - Cr in normal range and within 360 days    Creat  Date Value Ref Range Status  11/13/2021 0.95 0.60 - 1.29 mg/dL Final         Passed - AST in normal range and within 360 days    AST  Date Value Ref Range Status  04/06/2021 13 10 - 40 U/L Final         Passed - ALT in normal range and within 360 days    ALT  Date Value Ref Range Status  04/06/2021 15 9 - 46 U/L Final         Passed - eGFR is 30 or above and within 360 days    GFR, Est African American  Date Value Ref Range Status  02/29/2020 110 > OR = 60 mL/min/1.53m Final   GFR, Est Non African American  Date Value Ref Range Status  02/29/2020 95 > OR = 60 mL/min/1.716mFinal  eGFR  Date Value Ref Range Status  11/13/2021 100 > OR = 60 mL/min/1.28m Final    Comment:    The eGFR is based on the CKD-EPI 2021 equation. To calculate  the new eGFR from a previous Creatinine or Cystatin C result, go to https://www.kidney.org/professionals/ kdoqi/gfr%5Fcalculator          Passed - Patient is not pregnant      Passed - Valid encounter within last 12 months    Recent Outpatient Visits           4 months ago Right flank pain   [place] Mcgee [place]   11 months ago Special screening for malignant neoplasms, colon   [place]   1 year ago Fatigue, unspecified type   [place]ie Mcgee [place]   1 year ago Pure hypercholesterolemia   [place]   2 years ago [place]

## 2022-03-13 NOTE — Telephone Encounter (Signed)
Requested Prescriptions  Pending Prescriptions Disp Refills  . pregabalin (LYRICA) 50 MG capsule [Pharmacy Med Name: PREGABALIN 50 MG CAPSULE] 60 capsule 0    Sig: TAKE (1) CAPSULE BY MOUTH TWICE DAILY.     Not Delegated - Neurology:  Anticonvulsants - Controlled - pregabalin Failed - 03/13/2022 11:11 AM      Failed - This refill cannot be delegated      Passed - Cr in normal range and within 360 days    Creat  Date Value Ref Range Status  11/13/2021 0.95 0.60 - 1.29 mg/dL Final         Passed - Completed PHQ-2 or PHQ-9 in the last 360 days      Passed - Valid encounter within last 12 months    Recent Outpatient Visits          4 months ago Right flank pain   [place] [facility] Schaumann, Cammie Mcgee, MD   11 months ago Special screening for malignant neoplasms, colon   [place] Pickard, Cammie Mcgee, MD   1 year ago Fatigue, unspecified type   [place] Pickard, Cammie Mcgee, MD   1 year ago Pure hypercholesterolemia   [place] Pickard, Cammie Mcgee, MD   2 years ago [place], Cammie Mcgee, MD             . meloxicam ([place]) 15 MG tablet [Pharmacy Med Name: MELOXICAM 15 MG TABLET] 30 tablet 0    Sig: TAKE 1 TABLET BY MOUTH ONCE DAILY WITH A MEAL.     Analgesics:  COX2 Inhibitors Failed - 03/13/2022 11:11 AM      Failed - Manual Review: Labs are only required if the patient has taken medication for more than 8 weeks.      Failed - HGB in normal range and within 360 days    Hemoglobin  Date Value Ref Range Status  02/19/2021 14.6 13.2 - 17.1 g/dL Final         Failed - HCT in normal range and within 360 days    HCT  Date Value Ref Range Status  02/19/2021 44.4 38.5 - 50.0 % Final         Passed - Cr in normal range and within 360 days    Creat  Date Value Ref Range Status  11/13/2021 0.95 0.60 - 1.29 mg/dL Final         Passed - AST in normal range and within 360 days     AST  Date Value Ref Range Status  04/06/2021 13 10 - 40 U/L Final         Passed - ALT in normal range and within 360 days    ALT  Date Value Ref Range Status  04/06/2021 15 9 - 46 U/L Final         Passed - eGFR is 30 or above and within 360 days    GFR, Est African American  Date Value Ref Range Status  02/29/2020 110 > OR = 60 mL/min/1.70m Final   GFR, Est Non African American  Date Value Ref Range Status  02/29/2020 95 > OR = 60 mL/min/1.751mFinal   eGFR  Date Value Ref Range Status  11/13/2021 100 > OR = 60 mL/min/1.7383minal    Comment:    The eGFR is based on the CKD-EPI 2021 equation. To calculate  the new eGFR from a previous Creatinine or Cystatin  C result, go to https://www.kidney.org/professionals/ kdoqi/gfr%5Fcalculator          Passed - Patient is not pregnant      Passed - Valid encounter within last 12 months    Recent Outpatient Visits          4 months ago Right flank pain   [place] Pickard, Cammie Mcgee, MD   11 months ago Special screening for malignant neoplasms, colon   [place] Pickard, Cammie Mcgee, MD   1 year ago Fatigue, unspecified type   [place] Pickard, Cammie Mcgee, MD   1 year ago Pure hypercholesterolemia   [place] Pickard, Cammie Mcgee, MD   2 years ago [place] Pickard, Cammie Mcgee, MD

## 2022-03-22 ENCOUNTER — Ambulatory Visit (INDEPENDENT_AMBULATORY_CARE_PROVIDER_SITE_OTHER): Payer: BC Managed Care – PPO | Admitting: Family Medicine

## 2022-03-22 VITALS — BP 132/64 | HR 72 | Temp 98.2°F | Ht 69.5 in | Wt 234.0 lb

## 2022-03-22 DIAGNOSIS — L03011 Cellulitis of right finger: Secondary | ICD-10-CM

## 2022-03-22 MED ORDER — DOXYCYCLINE HYCLATE 100 MG PO TABS: 100.0000 mg | ORAL_TABLET | Freq: Two times a day (BID) | ORAL | 0 refills | Status: DC

## 2022-03-22 MED ORDER — TRAMADOL HCL 50 MG PO TABS: 50.0000 mg | ORAL_TABLET | Freq: Four times a day (QID) | ORAL | 0 refills | Status: DC | PRN

## 2022-03-22 NOTE — Progress Notes (Signed)
Subjective:    Patient ID: Stephen Allen, male    DOB: 1976-06-26, 46 y.o.   MRN: [id number]  Patient has an early paronychia forming adjacent to his fingernail on his right third digit.  The radial nail margin is slightly erythematous.  There is yellow impetigo like crusting at the base of the fingernail.  It is tender to touch in that area.  There is no visible abscess that needs to be drained.  Some mild infection at the present time. Past Medical History:  Diagnosis Date   Arthritis    Phreesia 04/03/2020   Asthma    Dx'd age 41   Asthma    Phreesia 04/03/2020   Migraines    Obesity    Past Surgical History:  Procedure Laterality Date   HEMANGIOMA EXCISION  04/21/2006   SHOULDER SURGERY Left 07/22/2004   TOE SURGERY Left 06/2021   [facility] (2nd toe)   Current Outpatient Medications on File Prior to Visit  Medication Sig Dispense Refill   ALPRAZolam (XANAX) 0.5 MG tablet TAKE 1 TABLET BY MOUTH 3 TIMES A DAY AS NEEDED FOR ANXIETY. 30 tablet 0   DULoxetine (CYMBALTA) 30 MG capsule Take 1 capsule (30 mg total) by mouth daily. 90 capsule 3   Glucos-Chondroit-Hyaluron-MSM (GLUCOSAMINE CHONDROITIN JOINT PO) Take by mouth.     hydrochlorothiazide (HYDRODIURIL) 25 MG tablet TAKE (1) TABLET BY MOUTH ONCE DAILY. 90 tablet 0   HYDROcodone-acetaminophen (NORCO) 10-325 MG tablet hydrocodone 10 mg-acetaminophen 325 mg tablet     meloxicam (MOBIC) 15 MG tablet TAKE 1 TABLET BY MOUTH ONCE DAILY WITH A MEAL. 30 tablet 0   metoprolol succinate (TOPROL-XL) 50 MG 24 hr tablet TAKE (1) TABLET BY MOUTH ONCE DAILY. (Patient taking differently: Take 50 mg by mouth as needed.) 90 tablet 0   pregabalin (LYRICA) 50 MG capsule TAKE (1) CAPSULE BY MOUTH TWICE DAILY. 60 capsule 0   sildenafil (VIAGRA) 100 MG tablet Take 0.5-1 tablets (50-100 mg total) by mouth daily as needed for erectile dysfunction. 5 tablet 11   traMADol (ULTRAM) 50 MG tablet Take 1 tablet (50 mg total) by mouth every 6 (six)  hours as needed. 120 tablet 5   No current facility-administered medications on file prior to visit.   Allergies  Allergen Reactions   Cortisone Other (See Comments)    Hx of retinal detachment   Albuterol     Migraine    Social History   Socioeconomic History   Marital status: Married    Spouse name: Not on file   Number of children: Not on file   Years of education: Not on file   Highest education level: Not on file  Occupational History   Not on file  Tobacco Use   Smoking status: Never   Smokeless tobacco: Never  Vaping Use   Vaping Use: Never used  Substance and Sexual Activity   Alcohol use: Yes    Comment: Rarely   Drug use: No   Sexual activity: Not on file  Other Topics Concern   Not on file  Social History Narrative   Not on file   Social Determinants of Health   Financial Resource Strain: Not on file  Food Insecurity: Not on file  Transportation Needs: Not on file  Physical Activity: Not on file  Stress: Not on file  Social Connections: Not on file  Intimate Partner Violence: Not on file     Review of Systems  All other systems reviewed and  are negative.      Objective:   Physical Exam Vitals reviewed.  Constitutional:      General: He is not in acute distress.    Appearance: Normal appearance. He is well-developed. He is obese. He is not diaphoretic.  Cardiovascular:     Rate and Rhythm: Normal rate and regular rhythm.     Heart sounds: Normal heart sounds. No murmur heard.    No friction rub. No gallop.  Pulmonary:     Effort: Pulmonary effort is normal.     Breath sounds: Normal breath sounds.  Lymphadenopathy:     Cervical: No cervical adenopathy.           Assessment & Plan:  Paronychia of finger of right hand Patient has a very mild paronychia forming.  I recommended warm salt water soaks and topical Polysporin.  I believe that the infection will likely resolve on its own.  If worsening I gave him a prescription for  doxycycline 100 mg twice daily for 7 days.  I did refill his tramadol 90 tablets that he takes 3 times a day for the bilateral hip pain along with fibromyalgia.  He was previously getting this from a pain clinic however he is no longer going to go to the pain clinic due to cost reasons and I agree to refill the medication

## 2022-04-03 ENCOUNTER — Other Ambulatory Visit: Payer: Self-pay

## 2022-04-03 ENCOUNTER — Encounter: Payer: Self-pay | Admitting: Family Medicine

## 2022-04-03 DIAGNOSIS — Z1211 Encounter for screening for malignant neoplasm of colon: Secondary | ICD-10-CM

## 2022-04-10 ENCOUNTER — Other Ambulatory Visit: Payer: Self-pay | Admitting: Family Medicine

## 2022-04-10 NOTE — Telephone Encounter (Signed)
Requested medication (s) are due for refill today - yes  Requested medication (s) are on the active medication list -yes  Future visit scheduled -no  Last refill: Tramadol 03/22/22 #90                  Pregabalin  03/15/22 #60  Notes to clinic: non delegated Rx  Requested Prescriptions  Pending Prescriptions Disp Refills   traMADol (ULTRAM) 50 MG tablet [Pharmacy Med Name: TRAMADOL HCL 50 MG TABLET] 90 tablet 0    Sig: TAKE (1) TABLET BY MOUTH EVERY SIX HOURS AS NEEDED.     Not Delegated - Analgesics:  Opioid Agonists Failed - 04/10/2022  9:20 AM      Failed - This refill cannot be delegated      Failed - Urine Drug Screen completed in last 360 days      Failed - Valid encounter within last 3 months    Recent Outpatient Visits           4 months ago Right flank pain   [facility] Tanya Nones, Priscille Heidelberg, MD   1 year ago Special screening for malignant neoplasms, colon   [facility] Pickard, Priscille Heidelberg, MD   1 year ago Fatigue, unspecified type   [facility] Pickard, Priscille Heidelberg, MD   2 years ago Pure hypercholesterolemia   [facility] Pickard, Priscille Heidelberg, MD   2 years ago Myalgia   [facility] Tanya Nones, Priscille Heidelberg, MD               pregabalin (LYRICA) 50 MG capsule [Pharmacy Med Name: PREGABALIN 50 MG CAPSULE] 60 capsule 0    Sig: TAKE (1) CAPSULE BY MOUTH TWICE DAILY.     Not Delegated - Neurology:  Anticonvulsants - Controlled - pregabalin Failed - 04/10/2022  9:20 AM      Failed - This refill cannot be delegated      Passed - Cr in normal range and within 360 days    Creat  Date Value Ref Range Status  11/13/2021 0.95 0.60 - 1.29 mg/dL Final         Passed - Completed PHQ-2 or PHQ-9 in the last 360 days      Passed - Valid encounter within last 12 months    Recent Outpatient Visits           4 months ago Right flank pain   [facility] Pickard, Priscille Heidelberg, MD   1 year ago  Special screening for malignant neoplasms, colon   [facility] Pickard, Priscille Heidelberg, MD   1 year ago Fatigue, unspecified type   [facility] Pickard, Priscille Heidelberg, MD   2 years ago Pure hypercholesterolemia   [facility] Pickard, Priscille Heidelberg, MD   2 years ago Myalgia   [facility] Pickard, Priscille Heidelberg, MD                 Requested Prescriptions  Pending Prescriptions Disp Refills   traMADol (ULTRAM) 50 MG tablet [Pharmacy Med Name: TRAMADOL HCL 50 MG TABLET] 90 tablet 0    Sig: TAKE (1) TABLET BY MOUTH EVERY SIX HOURS AS NEEDED.     Not Delegated - Analgesics:  Opioid Agonists Failed - 04/10/2022  9:20 AM      Failed - This refill cannot be delegated      Failed - Urine Drug Screen completed in last  360 days      Failed - Valid encounter within last 3 months    Recent Outpatient Visits           4 months ago Right flank pain   [place] Pickard, Cammie Mcgee, MD   1 year ago Special screening for malignant neoplasms, colon   [place] Pickard, Cammie Mcgee, MD   1 year ago Fatigue, unspecified type   [place] Pickard, Cammie Mcgee, MD   2 years ago Pure hypercholesterolemia   [place] Pickard, Cammie Mcgee, MD   2 years ago [place] Dennard Schaumann, Cammie Mcgee, MD               pregabalin (LYRICA) 50 MG capsule [Pharmacy Med Name: PREGABALIN 50 MG CAPSULE] 60 capsule 0    Sig: TAKE (1) CAPSULE BY MOUTH TWICE DAILY.     Not Delegated - Neurology:  Anticonvulsants - Controlled - pregabalin Failed - 04/10/2022  9:20 AM      Failed - This refill cannot be delegated      Passed - Cr in normal range and within 360 days    Creat  Date Value Ref Range Status  11/13/2021 0.95 0.60 - 1.29 mg/dL Final         Passed - Completed PHQ-2 or PHQ-9 in the last 360 days      Passed - Valid encounter within last 12 months    Recent  Outpatient Visits           4 months ago Right flank pain   [place] [facility] Schaumann, Cammie Mcgee, MD   1 year ago Special screening for malignant neoplasms, colon   [place] Susy Frizzle, MD   1 year ago Fatigue, unspecified type   [place] Susy Frizzle, MD   2 years ago Pure hypercholesterolemia   [place] Pickard, Cammie Mcgee, MD   2 years ago [place], Cammie Mcgee, MD

## 2022-04-20 ENCOUNTER — Other Ambulatory Visit: Payer: Self-pay | Admitting: Family Medicine

## 2022-05-10 ENCOUNTER — Encounter: Payer: Self-pay | Admitting: Family Medicine

## 2022-05-14 ENCOUNTER — Other Ambulatory Visit: Payer: Self-pay | Admitting: Family Medicine

## 2022-05-14 ENCOUNTER — Ambulatory Visit: Payer: BC Managed Care – PPO | Admitting: Family Medicine

## 2022-05-14 VITALS — BP 126/76 | HR 76 | Ht 69.5 in | Wt 231.4 lb

## 2022-05-14 DIAGNOSIS — M79644 Pain in right finger(s): Secondary | ICD-10-CM

## 2022-05-14 DIAGNOSIS — L929 Granulomatous disorder of the skin and subcutaneous tissue, unspecified: Secondary | ICD-10-CM

## 2022-05-14 MED ORDER — DOXYCYCLINE HYCLATE 100 MG PO TABS: 100.0000 mg | ORAL_TABLET | Freq: Two times a day (BID) | ORAL | 0 refills | Status: DC

## 2022-05-14 NOTE — Progress Notes (Signed)
Subjective:    Patient ID: Stephen Allen, male    DOB: [birth year], 46 y.o.   MRN: [id number]  I saw the patient in early September and treated him for a paronychia on his right third finger with doxycycline.  He states that he "has come back".  Therefore I asked the patient to come in for possible incision and drainage.  However today, the patient has no sign of a paronychia.  Instead the skin proximal to the lunula is very tender.  There is excessive granulation tissue and possibly a pyogenic granuloma literally oozing out from underneath the nail margin onto the proximal nail.  It is a beefy red small mass that bleeds easily.  It is only about 2 mm in diameter but I believe that there is more covered by the proximal nail fold.  It has been there long enough that it is actually deforming the nail as he has longitudinal ridges extending out from that area for the entire length of the nail.  Therefore I believe that he likely has a pyogenic granuloma underneath the proximal nail fold damaging the nail matrix and causing pain.  This will require destruction with silver nitrate to treat however the only way I know to do that would be to remove the fingernail and use silver nitrate Past Medical History:  Diagnosis Date   Arthritis    [employer] 04/03/2020   Asthma    Dx'd age 29   Asthma    [employer] 04/03/2020   Migraines    Obesity    Past Surgical History:  Procedure Laterality Date   HEMANGIOMA EXCISION  04/21/2006   SHOULDER SURGERY Left 07/22/2004   TOE SURGERY Left 06/2021   [facility] (2nd toe)   Current Outpatient Medications on File Prior to Visit  Medication Sig Dispense Refill   ALPRAZolam (XANAX) 0.5 MG tablet TAKE 1 TABLET BY MOUTH 3 TIMES A DAY AS NEEDED FOR ANXIETY. 30 tablet 0   DULoxetine (CYMBALTA) 30 MG capsule Take 1 capsule (30 mg total) by mouth daily. 90 capsule 3   Glucos-Chondroit-Hyaluron-MSM (GLUCOSAMINE CHONDROITIN JOINT PO) Take by mouth.      hydrochlorothiazide (HYDRODIURIL) 25 MG tablet TAKE (1) TABLET BY MOUTH ONCE DAILY. 90 tablet 0   meloxicam (MOBIC) 15 MG tablet TAKE 1 TABLET BY MOUTH ONCE DAILY WITH A MEAL. 30 tablet 0   metoprolol succinate (TOPROL-XL) 50 MG 24 hr tablet TAKE (1) TABLET BY MOUTH ONCE DAILY. (Patient taking differently: Take 50 mg by mouth as needed.) 90 tablet 0   pregabalin (LYRICA) 50 MG capsule TAKE (1) CAPSULE BY MOUTH TWICE DAILY. 60 capsule 0   sildenafil (VIAGRA) 100 MG tablet Take 0.5-1 tablets (50-100 mg total) by mouth daily as needed for erectile dysfunction. 5 tablet 0   traMADol (ULTRAM) 50 MG tablet TAKE (1) TABLET BY MOUTH EVERY SIX HOURS AS NEEDED. 90 tablet 0   No current facility-administered medications on file prior to visit.   Allergies  Allergen Reactions   Cortisone Other (See Comments)    Hx of retinal detachment   Albuterol     Migraine    Social History   Socioeconomic History   Marital status: Married    Spouse name: Not on file   Number of children: Not on file   Years of education: Not on file   Highest education level: Not on file  Occupational History   Not on file  Tobacco Use   Smoking status: Never  Smokeless tobacco: Never  Vaping Use   Vaping Use: Never used  Substance and Sexual Activity   Alcohol use: Yes    Comment: Rarely   Drug use: No   Sexual activity: Not on file  Other Topics Concern   Not on file  Social History Narrative   Not on file   Social Determinants of Health   Financial Resource Strain: Not on file  Food Insecurity: Not on file  Transportation Needs: Not on file  Physical Activity: Not on file  Stress: Not on file  Social Connections: Not on file  Intimate Partner Violence: Not on file     Review of Systems  All other systems reviewed and are negative.      Objective:   Physical Exam Vitals reviewed.  Constitutional:      General: He is not in acute distress.    Appearance: Normal appearance. He is  well-developed. He is obese. He is not diaphoretic.  Cardiovascular:     Rate and Rhythm: Normal rate and regular rhythm.     Heart sounds: Normal heart sounds. No murmur heard.    No friction rub. No gallop.  Pulmonary:     Effort: Pulmonary effort is normal.     Breath sounds: Normal breath sounds.  Lymphadenopathy:     Cervical: No cervical adenopathy.    There is no visible cellulitis or paronychia today.  The radial proximal nail fold is tender and slightly swollen.  There is excessive granulation tissue and possibly a pyogenic granuloma oozing out from underneath the proximal nail fold onto the fingernail.       Assessment & Plan:  Excessive granulation tissue - Plan: Ambulatory referral to Orthopedic Surgery I see no indication for incision and drainage and I see no indication for antibiotics.  I believe that the patient likely has a pyogenic granuloma growing underneath the proximal nail fold due to chronic irritation and scarring.  I believe is actually deforming the nail matrix causing the abnormal shape of his fingernail.  Therefore to treat the granulation tissue, I believe that we would need to perform a digital block, remove that portion of the fingernail and then treat the underlying nail matrix with silver nitrate to destroy any excessive granulation tissue.  Obviously this seems drastic.  Therefore I recommended a consultation with a hand specialist to see if they have any other options for management

## 2022-05-15 DIAGNOSIS — M79645 Pain in left finger(s): Secondary | ICD-10-CM | POA: Insufficient documentation

## 2022-05-15 NOTE — Telephone Encounter (Signed)
Requested medication (s) are due for refill today: yes  Requested medication (s) are on the active medication list: yes  Last refill:  Meloxicam: 04/22/22 #30                        pregabalin: 04/12/22 #60  Future visit scheduled: no  Notes to clinic:  overdue labs and pregabalin not delegated to NT to Reorder   Requested Prescriptions  Pending Prescriptions Disp Refills   meloxicam (MOBIC) 15 MG tablet [Pharmacy Med Name: MELOXICAM 15 MG TABLET] 30 tablet 0    Sig: TAKE 1 TABLET BY MOUTH ONCE DAILY WITH A MEAL.     Analgesics:  COX2 Inhibitors Failed - 05/14/2022  9:05 AM      Failed - Manual Review: Labs are only required if the patient has taken medication for more than 8 weeks.      Failed - HGB in normal range and within 360 days    Hemoglobin  Date Value Ref Range Status  02/19/2021 14.6 13.2 - 17.1 g/dL Final         Failed - HCT in normal range and within 360 days    HCT  Date Value Ref Range Status  02/19/2021 44.4 38.5 - 50.0 % Final         Failed - AST in normal range and within 360 days    AST  Date Value Ref Range Status  04/06/2021 13 10 - 40 U/L Final         Failed - ALT in normal range and within 360 days    ALT  Date Value Ref Range Status  04/06/2021 15 9 - 46 U/L Final         Passed - Cr in normal range and within 360 days    Creat  Date Value Ref Range Status  11/13/2021 0.95 0.60 - 1.29 mg/dL Final         Passed - eGFR is 30 or above and within 360 days    GFR, Est African American  Date Value Ref Range Status  02/29/2020 110 > OR = 60 mL/min/1.14m Final   GFR, Est Non African American  Date Value Ref Range Status  02/29/2020 95 > OR = 60 mL/min/1.755mFinal   eGFR  Date Value Ref Range Status  11/13/2021 100 > OR = 60 mL/min/1.7348minal    Comment:    The eGFR is based on the CKD-EPI 2021 equation. To calculate  the new eGFR from a previous Creatinine or Cystatin C result, go to  https://www.kidney.org/professionals/ kdoqi/gfr%5Fcalculator          Passed - Patient is not pregnant      Passed - Valid encounter within last 12 months    Recent Outpatient Visits           6 months ago Right flank pain   [place], [facility] Mcgee[facility]   1 year ago Special screening for malignant neoplasms, colon   [place], [facility] Mcgee[facility]   1 year ago Fatigue, unspecified type   [facility]   2 years ago Pure hypercholesterolemia   [place], [facility] Mcgee[facility]   2 years ago [facility]               pregabalin (LYRICA) 50 MG capsule [Pharmacy Med Name: PREGABALIN 50  MG CAPSULE] 60 capsule 0    Sig: TAKE (1) CAPSULE BY MOUTH TWICE DAILY.     Not Delegated - Neurology:  Anticonvulsants - Controlled - pregabalin Failed - 05/14/2022  9:05 AM      Failed - This refill cannot be delegated      Passed - Cr in normal range and within 360 days    Creat  Date Value Ref Range Status  11/13/2021 0.95 0.60 - 1.29 mg/dL Final         Passed - Completed PHQ-2 or PHQ-9 in the last 360 days      Passed - Valid encounter within last 12 months    Recent Outpatient Visits           6 months ago Right flank pain   [place] [facility] Schaumann, Cammie Mcgee, MD   1 year ago Special screening for malignant neoplasms, colon   [place] Susy Frizzle, MD   1 year ago Fatigue, unspecified type   [place] Susy Frizzle, MD   2 years ago Pure hypercholesterolemia   [place] Pickard, Cammie Mcgee, MD   2 years ago [place], Cammie Mcgee, MD

## 2022-05-16 ENCOUNTER — Other Ambulatory Visit: Payer: Self-pay | Admitting: Family Medicine

## 2022-05-16 NOTE — Telephone Encounter (Signed)
Requested Prescriptions  Pending Prescriptions Disp Refills  . meloxicam (MOBIC) 15 MG tablet [Pharmacy Med Name: MELOXICAM 15 MG TABLET] 30 tablet 0    Sig: TAKE 1 TABLET BY MOUTH ONCE DAILY WITH A MEAL.     Analgesics:  COX2 Inhibitors Failed - 05/16/2022  8:54 AM      Failed - Manual Review: Labs are only required if the patient has taken medication for more than 8 weeks.      Failed - HGB in normal range and within 360 days    Hemoglobin  Date Value Ref Range Status  02/19/2021 14.6 13.2 - 17.1 g/dL Final         Failed - HCT in normal range and within 360 days    HCT  Date Value Ref Range Status  02/19/2021 44.4 38.5 - 50.0 % Final         Failed - AST in normal range and within 360 days    AST  Date Value Ref Range Status  04/06/2021 13 10 - 40 U/L Final         Failed - ALT in normal range and within 360 days    ALT  Date Value Ref Range Status  04/06/2021 15 9 - 46 U/L Final         Passed - Cr in normal range and within 360 days    Creat  Date Value Ref Range Status  11/13/2021 0.95 0.60 - 1.29 mg/dL Final         Passed - eGFR is 30 or above and within 360 days    GFR, Est African American  Date Value Ref Range Status  02/29/2020 110 > OR = 60 mL/min/1.71m Final   GFR, Est Non African American  Date Value Ref Range Status  02/29/2020 95 > OR = 60 mL/min/1.764mFinal   eGFR  Date Value Ref Range Status  11/13/2021 100 > OR = 60 mL/min/1.7339minal    Comment:    The eGFR is based on the CKD-EPI 2021 equation. To calculate  the new eGFR from a previous Creatinine or Cystatin C result, go to https://www.kidney.org/professionals/ kdoqi/gfr%5Fcalculator          Passed - Patient is not pregnant      Passed - Valid encounter within last 12 months    Recent Outpatient Visits          6 months ago Right flank pain   [facility], [facility] McgeeD   1 year ago Special screening for malignant neoplasms, colon   [facility], War[facility]ammie McgeeD   1 year ago Fatigue, unspecified type   [place]   2 years ago Pure hypercholesterolemia   [place], [facility]ammie McgeeD   2 years ago [place], War[facility]ammie McgeeD

## 2022-05-21 ENCOUNTER — Encounter: Payer: Self-pay | Admitting: Physical Medicine and Rehabilitation

## 2022-05-23 ENCOUNTER — Other Ambulatory Visit: Payer: Self-pay | Admitting: Family Medicine

## 2022-05-23 NOTE — Telephone Encounter (Signed)
Requested medication (s) are due for refill today: Tramadol yes  Requested medication (s) are on the active medication list: yes  Last refill: Tramadol 04/12/22 #90  0 refills  Pregabalin  05/16/22  #60 0 refills  Future visit scheduled no  Notes to clinic:Not delegated, please review.    Pregabalin not due, cannot refuse non-delegated meds.  Requested Prescriptions  Pending Prescriptions Disp Refills   traMADol (ULTRAM) 50 MG tablet [Pharmacy Med Name: TRAMADOL HCL 50 MG TABLET] 90 tablet 0    Sig: TAKE (1) TABLET BY MOUTH EVERY SIX HOURS AS NEEDED.     Not Delegated - Analgesics:  Opioid Agonists Failed - 05/23/2022  2:13 PM      Failed - This refill cannot be delegated      Failed - Urine Drug Screen completed in last 360 days      Failed - Valid encounter within last 3 months    Recent Outpatient Visits           6 months ago Right flank pain   [place] [facility] Schaumann, Cammie Mcgee, MD   1 year ago Special screening for malignant neoplasms, colon   [place] Pickard, Cammie Mcgee, MD   1 year ago Fatigue, unspecified type   [place] Pickard, Cammie Mcgee, MD   2 years ago Pure hypercholesterolemia   [place] Pickard, Cammie Mcgee, MD   2 years ago [place] Dennard Schaumann, Cammie Mcgee, MD               pregabalin (LYRICA) 50 MG capsule [Pharmacy Med Name: PREGABALIN 50 MG CAPSULE] 60 capsule 0    Sig: TAKE (1) CAPSULE BY MOUTH TWICE DAILY.     Not Delegated - Neurology:  Anticonvulsants - Controlled - pregabalin Failed - 05/23/2022  2:13 PM      Failed - This refill cannot be delegated      Passed - Cr in normal range and within 360 days    Creat  Date Value Ref Range Status  11/13/2021 0.95 0.60 - 1.29 mg/dL Final         Passed - Completed PHQ-2 or PHQ-9 in the last 360 days      Passed - Valid encounter within last 12 months    Recent Outpatient Visits           6 months ago  Right flank pain   [place] [facility] Schaumann, Cammie Mcgee, MD   1 year ago Special screening for malignant neoplasms, colon   [place] Susy Frizzle, MD   1 year ago Fatigue, unspecified type   [place] Susy Frizzle, MD   2 years ago Pure hypercholesterolemia   [place] Pickard, Cammie Mcgee, MD   2 years ago [place], Cammie Mcgee, MD

## 2022-05-24 DIAGNOSIS — L03019 Cellulitis of unspecified finger: Secondary | ICD-10-CM | POA: Insufficient documentation

## 2022-05-26 ENCOUNTER — Telehealth: Payer: BC Managed Care – PPO | Admitting: Family

## 2022-05-26 DIAGNOSIS — R030 Elevated blood-pressure reading, without diagnosis of hypertension: Secondary | ICD-10-CM | POA: Diagnosis not present

## 2022-05-26 DIAGNOSIS — R519 Headache, unspecified: Secondary | ICD-10-CM | POA: Diagnosis not present

## 2022-05-26 MED ORDER — SUMATRIPTAN SUCCINATE 100 MG PO TABS: 100.0000 mg | ORAL_TABLET | ORAL | 0 refills | Status: DC | PRN

## 2022-05-26 NOTE — Progress Notes (Signed)
Virtual Visit Consent   GRIFFYN Allen, you are scheduled for a virtual visit with a [facility] provider today. Just as with appointments in the office, your consent must be obtained to participate. Your consent will be active for this visit and any virtual visit you may have with one of our providers in the next 365 days. If you have a MyChart account, a copy of this consent can be sent to you electronically.  As this is a virtual visit, video technology does not allow for your provider to perform a traditional examination. This may limit your provider's ability to fully assess your condition. If your provider identifies any concerns that need to be evaluated in person or the need to arrange testing (such as labs, EKG, etc.), we will make arrangements to do so. Although advances in technology are sophisticated, we cannot ensure that it will always work on either your end or our end. If the connection with a video visit is poor, the visit may have to be switched to a telephone visit. With either a video or telephone visit, we are not always able to ensure that we have a secure connection.  By engaging in this virtual visit, you consent to the provision of healthcare and authorize for your insurance to be billed (if applicable) for the services provided during this visit. Depending on your insurance coverage, you may receive a charge related to this service.  I need to obtain your verbal consent now. Are you willing to proceed with your visit today? MARQUIS DOWN has provided verbal consent on 05/26/2022 for a virtual visit (video or telephone). Jannifer Rodney, FNP  Date: 05/26/2022 1:46 PM  Virtual Visit via Video Note   I, Jannifer Rodney, connected with  Stephen Allen  ([id number], 1975-09-09) on 05/26/22 at  1:30 PM EST by a video-enabled telemedicine application and verified that I am speaking with the correct person using two identifiers.  Location: Patient: Virtual Visit Location Patient:  Home Provider: Virtual Visit Location Provider: Home Office   I discussed the limitations of evaluation and management by telemedicine and the availability of in person appointments. The patient expressed understanding and agreed to proceed.    History of Present Illness: Stephen Allen is a 46 y.o. who identifies as a male who was assigned male at birth, and is being seen today for headache that started yesterday. Reports he took his BP today and it was 150's/80's.  HPI: Headache  This is a recurrent problem. The current episode started yesterday. The problem occurs constantly. The problem has been unchanged. The pain is located in the Frontal region. The pain does not radiate. The pain quality is similar to prior headaches. The quality of the pain is described as aching. The pain is at a severity of 7/10. The pain is moderate. Pertinent negatives include no abnormal behavior, blurred vision, coughing, nausea, phonophobia, photophobia, scalp tenderness, sinus pressure, sore throat or vomiting.    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 May repeat in 2 hours if headache persists or recurs., Disp: 10 tablet, Rfl: 0   ALPRAZolam (XANAX) 0.5 MG tablet, TAKE 1 TABLET BY MOUTH 3 TIMES A DAY AS NEEDED FOR ANXIETY.,  Disp: 30 tablet, Rfl: 0   DULoxetine (CYMBALTA) 30 MG capsule, Take 1 capsule (30 mg total) by mouth daily., Disp: 90 capsule, Rfl: 3   Glucos-Chondroit-Hyaluron-MSM (GLUCOSAMINE CHONDROITIN JOINT PO), Take by mouth., Disp: , Rfl:    hydrochlorothiazide (HYDRODIURIL) 25 MG tablet, TAKE (1) TABLET BY MOUTH ONCE DAILY., Disp: 90 tablet, Rfl: 0   meloxicam (MOBIC) 15 MG tablet, TAKE 1 TABLET BY MOUTH ONCE DAILY WITH A MEAL., Disp: 30 tablet, Rfl: 1   metoprolol succinate (TOPROL-XL) 50 MG 24 hr tablet, TAKE (1) TABLET BY MOUTH ONCE DAILY. (Patient taking differently: Take 50 mg by mouth as needed.), Disp: 90 tablet, Rfl: 0   pregabalin (LYRICA) 50 MG capsule, TAKE (1) CAPSULE BY MOUTH TWICE DAILY., Disp: 60 capsule, Rfl: 0   sildenafil (VIAGRA) 100 MG tablet, Take 0.5-1 tablets (50-100 mg total) by mouth daily as needed for erectile dysfunction., Disp: 5 tablet, Rfl: 0   traMADol (ULTRAM) 50 MG tablet, TAKE (1) TABLET BY MOUTH EVERY SIX HOURS AS NEEDED., Disp: 90 tablet, Rfl: 0  Observations/Objective: Patient is well-developed, well-nourished in no acute distress.  Resting comfortably  at home.  Head is normocephalic, atraumatic.  No labored breathing.  Speech is clear and coherent with logical content.  Patient is alert and oriented at baseline.    Assessment and Plan: 1. Acute nonintractable headache, unspecified headache type - SUMAtriptan (IMITREX) 100 MG tablet; Take 1 tablet (100 mg total) by mouth every 2 (two) hours as needed for migraine. May repeat in 2 hours if headache persists or recurs.  Dispense: 10 tablet; Refill: 0  2. Elevated blood pressure reading  Stress management  Start Imitrex as needed Bp elevated today, but more than likely related to headache. If still elevated needs to follow up with PCP Headache journal  Follow up with PCP  Follow Up Instructions: I discussed the assessment and treatment plan with the patient. The patient was provided an opportunity to ask  questions and all were answered. The patient agreed with the plan and demonstrated an understanding of the instructions.  A copy of instructions were sent to the patient via MyChart unless otherwise noted below.     The patient was advised to call back or seek an in-person evaluation if the symptoms worsen or if the condition fails to improve as anticipated.  Time:  I spent 12 minutes with the patient via telehealth technology discussing the above problems/concerns.    Evelina Dun, FNP

## 2022-05-26 NOTE — Patient Instructions (Signed)
Tension Headache, Adult A tension headache is a feeling of pain, pressure, or aching over the front and sides of the head. The pain can be dull, or it can feel tight. There are two types of tension headache: Episodic tension headache. This is when the headaches happen fewer than 15 days a month. Chronic tension headache. This is when the headaches happen more than 15 days a month during a 3-month period. A tension headache can last from 30 minutes to several days. It is the most common kind of headache. Tension headaches are not normally associated with nausea or vomiting, and they do not get worse with physical activity. What are the causes? The exact cause of this condition is not known. Tension headaches are often triggered by stress, anxiety, or depression. Other triggers may include: Alcohol. Too much caffeine or caffeine withdrawal. Respiratory infections, such as colds, flu, or sinus infections. Dental problems or teeth clenching. Fatigue. Holding your head and neck in the same position for a long period of time, such as while using a computer. Smoking. Arthritis of the neck. What are the signs or symptoms? Symptoms of this condition include: A feeling of pressure or tightness around the head. Dull, aching head pain. Pain over the front and sides of the head. Tenderness in the muscles of the head, neck, and shoulders. How is this diagnosed? This condition may be diagnosed based on your symptoms, your medical history, and a physical exam. If your symptoms are severe or unusual, you may have imaging tests, such as a CT scan or an MRI of your head. Your vision may also be checked. How is this treated? This condition may be treated with lifestyle changes and with medicines that help relieve symptoms. Follow these instructions at home: Managing pain Take over-the-counter and prescription medicines only as told by your health care provider. When you have a headache, lie down in a dark,  quiet room. If directed, put ice on your head and neck. To do this: Put ice in a plastic bag. Place a towel between your skin and the bag. Leave the ice on for 20 minutes, 2-3 times a day. Remove the ice if your skin turns bright red. This is very important. If you cannot feel pain, heat, or cold, you have a greater risk of damage to the area. If directed, apply heat to the back of your neck as often as told by your health care provider. Use the heat source that your health care provider recommends, such as a moist heat pack or a heating pad. Place a towel between your skin and the heat source. Leave the heat on for 20-30 minutes. Remove the heat if your skin turns bright red. This is especially important if you are unable to feel pain, heat, or cold. You have a greater risk of getting burned. Eating and drinking Eat meals on a regular schedule. If you drink alcohol: Limit how much you have to: 0-1 drink a day for women who are not pregnant. 0-2 drinks a day for men. Know how much alcohol is in your drink. In the [place] one drink equals one 12 oz bottle of beer (355 mL), one 5 oz glass of wine (148 mL), or one 1 oz glass of hard liquor (44 mL). Drink enough fluid to keep your urine pale yellow. Decrease your caffeine intake, or stop using caffeine. Lifestyle Get 7-9 hours of sleep each night, or get the amount of sleep recommended by your health care provider. At bedtime,   remove computers, phones, and tablets from your room. Find ways to manage your stress. This may include: Exercise. Deep breathing exercises. Yoga. Listening to music. Positive mental imagery. Try to sit up straight and avoid tensing your muscles. Do not use any products that contain nicotine or tobacco. These include cigarettes, chewing tobacco, and vaping devices, such as e-cigarettes. If you need help quitting, ask your health care provider. General instructions  Avoid any headache triggers. Keep a journal to help  find out what may trigger your headaches. For example, write down: What you eat and drink. How much sleep you get. Any change to your diet or medicines. Keep all follow-up visits. This is important. Contact a health care provider if: Your headache does not get better. Your headache comes back. You are sensitive to sounds, light, or smells because of a headache. You have nausea or you vomit. Your stomach hurts. Get help right away if: You suddenly develop a severe headache, along with any of the following: A stiff neck. Nausea and vomiting. Confusion. Weakness in one part or one side of your body. Double vision or loss of vision. Shortness of breath. Rash. Unusual sleepiness. Fever or chills. Trouble speaking. Pain in your eye or ear. Trouble walking or balancing. Feeling faint or passing out. Summary A tension headache is a feeling of pain, pressure, or aching over the front and sides of the head. A tension headache can last from 30 minutes to several days. It is the most common kind of headache. This condition may be diagnosed based on your symptoms, your medical history, and a physical exam. This condition may be treated with lifestyle changes and with medicines that help relieve symptoms. This information is not intended to replace advice given to you by your health care provider. Make sure you discuss any questions you have with your health care provider. Document Revised: 04/06/2020 Document Reviewed: 04/06/2020 Elsevier Patient Education  2023 [employer]vier Inc.  

## 2022-06-22 ENCOUNTER — Other Ambulatory Visit: Payer: Self-pay | Admitting: Family Medicine

## 2022-06-24 NOTE — Telephone Encounter (Signed)
Requested medication (s) are due for refill today:Yes  Requested medication (s) are on the active medication list: Yes  Last refill:  05/24/22  Future visit scheduled:Yes  Notes to clinic:  Unable to refill per protocol, cannot delegate.      Requested Prescriptions  Pending Prescriptions Disp Refills   pregabalin (LYRICA) 50 MG capsule [Pharmacy Med Name: PREGABALIN 50 MG CAPSULE] 60 capsule 0    Sig: TAKE (1) CAPSULE BY MOUTH TWICE DAILY.     Not Delegated - Neurology:  Anticonvulsants - Controlled - pregabalin Failed - 06/22/2022  9:42 AM      Failed - This refill cannot be delegated      Passed - Cr in normal range and within 360 days    Creat  Date Value Ref Range Status  11/13/2021 0.95 0.60 - 1.29 mg/dL Final         Passed - Completed PHQ-2 or PHQ-9 in the last 360 days      Passed - Valid encounter within last 12 months    Recent Outpatient Visits           7 months ago Right flank pain   [place] [facility] Schaumann, Cammie Mcgee, MD   1 year ago Special screening for malignant neoplasms, colon   [place] Susy Frizzle, MD   1 year ago Fatigue, unspecified type   [place] Pickard, Cammie Mcgee, MD   2 years ago Pure hypercholesterolemia   [place] Pickard, Cammie Mcgee, MD   2 years ago [place] Pickard, Cammie Mcgee, MD              Signed Prescriptions Disp Refills   DULoxetine (CYMBALTA) 30 MG capsule 90 capsule 2    Sig: TAKE (1) CAPSULE BY MOUTH ONCE DAILY.     Psychiatry: Antidepressants - SNRI - duloxetine Failed - 06/22/2022  9:42 AM      Failed - Valid encounter within last 6 months    Recent Outpatient Visits           7 months ago Right flank pain   [place] Pickard, Cammie Mcgee, MD   1 year ago Special screening for malignant neoplasms, colon   [place] Pickard, Cammie Mcgee, MD   1 year ago Fatigue, unspecified type    [place] Pickard, Cammie Mcgee, MD   2 years ago Pure hypercholesterolemia   [place] Dennard Schaumann, Cammie Mcgee, MD   2 years ago [place] Dennard Schaumann, Cammie Mcgee, MD              Passed - Cr in normal range and within 360 days    Creat  Date Value Ref Range Status  11/13/2021 0.95 0.60 - 1.29 mg/dL Final         Passed - eGFR is 30 or above and within 360 days    GFR, Est African American  Date Value Ref Range Status  02/29/2020 110 > OR = 60 mL/min/1.47m Final   GFR, Est Non African American  Date Value Ref Range Status  02/29/2020 95 > OR = 60 mL/min/1.758mFinal   eGFR  Date Value Ref Range Status  11/13/2021 100 > OR = 60 mL/min/1.7381minal    Comment:    The eGFR is based on the CKD-EPI 2021 equation. To calculate  the new eGFR from a previous  Creatinine or Cystatin C result, go to https://www.kidney.org/professionals/ kdoqi/gfr%5Fcalculator          Passed - Completed PHQ-2 or PHQ-9 in the last 360 days      Passed - Last BP in normal range    BP Readings from Last 1 Encounters:  05/14/22 126/76

## 2022-06-24 NOTE — Telephone Encounter (Signed)
Requested Prescriptions  Pending Prescriptions Disp Refills   DULoxetine (CYMBALTA) 30 MG capsule [Pharmacy Med Name: DULOXETINE HCL DR 30 MG CAP] 90 capsule 2    Sig: TAKE (1) CAPSULE BY MOUTH ONCE DAILY.     Psychiatry: Antidepressants - SNRI - duloxetine Failed - 06/22/2022  9:42 AM      Failed - Valid encounter within last 6 months    Recent Outpatient Visits           7 months ago Right flank pain   [place] Pickard, Cammie Mcgee, MD   1 year ago Special screening for malignant neoplasms, colon   [place] Pickard, Cammie Mcgee, MD   1 year ago Fatigue, unspecified type   [place] Pickard, Cammie Mcgee, MD   2 years ago Pure hypercholesterolemia   [place] Dennard Schaumann, Cammie Mcgee, MD   2 years ago [place] Dennard Schaumann, Cammie Mcgee, MD              Passed - Cr in normal range and within 360 days    Creat  Date Value Ref Range Status  11/13/2021 0.95 0.60 - 1.29 mg/dL Final         Passed - eGFR is 30 or above and within 360 days    GFR, Est African American  Date Value Ref Range Status  02/29/2020 110 > OR = 60 mL/min/1.31m Final   GFR, Est Non African American  Date Value Ref Range Status  02/29/2020 95 > OR = 60 mL/min/1.742mFinal   eGFR  Date Value Ref Range Status  11/13/2021 100 > OR = 60 mL/min/1.7352minal    Comment:    The eGFR is based on the CKD-EPI 2021 equation. To calculate  the new eGFR from a previous Creatinine or Cystatin C result, go to https://www.kidney.org/professionals/ kdoqi/gfr%5Fcalculator          Passed - Completed PHQ-2 or PHQ-9 in the last 360 days      Passed - Last BP in normal range    BP Readings from Last 1 Encounters:  05/14/22 126/76          pregabalin (LYRICA) 50 MG capsule [Pharmacy Med Name: PREGABALIN 50 MG CAPSULE] 60 capsule 0    Sig: TAKE (1) CAPSULE BY MOUTH TWICE DAILY.     Not Delegated - Neurology:  Anticonvulsants -  Controlled - pregabalin Failed - 06/22/2022  9:42 AM      Failed - This refill cannot be delegated      Passed - Cr in normal range and within 360 days    Creat  Date Value Ref Range Status  11/13/2021 0.95 0.60 - 1.29 mg/dL Final         Passed - Completed PHQ-2 or PHQ-9 in the last 360 days      Passed - Valid encounter within last 12 months    Recent Outpatient Visits           7 months ago Right flank pain   [place] [facility]   1 year ago Special screening for malignant neoplasms, colon   [place]   1 year ago Fatigue, unspecified type   [place]   2 years ago Pure hypercholesterolemia   [place]   2 years ago [place]  [place] Pickard, Cammie Mcgee, MD

## 2022-07-10 ENCOUNTER — Other Ambulatory Visit: Payer: Self-pay | Admitting: Family Medicine

## 2022-07-10 NOTE — Telephone Encounter (Signed)
Requested medication (s) are due for refill today: routing for review  Requested medication (s) are on the active medication list: yes  Last refill:  05/24/22  Future visit scheduled: no  Notes to clinic:  Unable to refill per protocol, cannot delegate.      Requested Prescriptions  Pending Prescriptions Disp Refills   traMADol (ULTRAM) 50 MG tablet [Pharmacy Med Name: TRAMADOL HCL 50 MG TABLET] 90 tablet 0    Sig: TAKE (1) TABLET BY MOUTH EVERY SIX HOURS AS NEEDED.     Not Delegated - Analgesics:  Opioid Agonists Failed - 07/10/2022  8:55 AM      Failed - This refill cannot be delegated      Failed - Urine Drug Screen completed in last 360 days      Failed - Valid encounter within last 3 months    Recent Outpatient Visits           7 months ago Right flank pain   [facility] Tanya Nones, Priscille Heidelberg, MD   1 year ago Special screening for malignant neoplasms, colon   [facility] Donita Brooks, MD   1 year ago Fatigue, unspecified type   [facility] Donita Brooks, MD   2 years ago Pure hypercholesterolemia   [facility] Pickard, Priscille Heidelberg, MD   2 years ago Myalgia   [facility] Pickard, Priscille Heidelberg, MD

## 2022-07-12 DIAGNOSIS — R2232 Localized swelling, mass and lump, left upper limb: Secondary | ICD-10-CM | POA: Insufficient documentation

## 2022-07-25 ENCOUNTER — Other Ambulatory Visit: Payer: Self-pay

## 2022-07-25 ENCOUNTER — Other Ambulatory Visit: Payer: Self-pay | Admitting: Family Medicine

## 2022-07-25 ENCOUNTER — Telehealth: Payer: Self-pay

## 2022-07-25 DIAGNOSIS — R030 Elevated blood-pressure reading, without diagnosis of hypertension: Secondary | ICD-10-CM

## 2022-07-25 DIAGNOSIS — I10 Essential (primary) hypertension: Secondary | ICD-10-CM

## 2022-07-25 MED ORDER — HYDROCHLOROTHIAZIDE 25 MG PO TABS: ORAL_TABLET | ORAL | 0 refills | Status: DC

## 2022-07-25 NOTE — Telephone Encounter (Signed)
Requested medication (s) are due for refill today - yes  Requested medication (s) are on the active medication list -yes  Future visit scheduled -no  Last refill: Hydrochlorothiazide- 04/22/22 #90                  Pregabalin - 06/24/22 #60  Notes to clinic: Attempted to call patient to schedule appointment- patient has been to office- but for acute complaints- no follow up for medications - left message to call office for appointment. Non delegated Rx, fails appointment protocol- sent for review   Requested Prescriptions  Pending Prescriptions Disp Refills   hydrochlorothiazide (HYDRODIURIL) 25 MG tablet [Pharmacy Med Name: HYDROCHLOROTHIAZIDE 25 MG] 90 tablet 0    Sig: TAKE (1) TABLET BY MOUTH ONCE DAILY.     Cardiovascular: Diuretics - Thiazide Failed - 07/25/2022  9:02 AM      Failed - Cr in normal range and within 180 days    Creat  Date Value Ref Range Status  11/13/2021 0.95 0.60 - 1.29 mg/dL Final         Failed - K in normal range and within 180 days    Potassium  Date Value Ref Range Status  11/13/2021 3.7 3.5 - 5.3 mmol/L Final         Failed - Na in normal range and within 180 days    Sodium  Date Value Ref Range Status  11/13/2021 140 135 - 146 mmol/L Final         Failed - Valid encounter within last 6 months    Recent Outpatient Visits           8 months ago Right flank pain   [place] Susy Frizzle, MD   1 year ago Special screening for malignant neoplasms, colon   [place] Pickard, Cammie Mcgee, MD   1 year ago Fatigue, unspecified type   [place] Pickard, Cammie Mcgee, MD   2 years ago Pure hypercholesterolemia   [place] Pickard, Cammie Mcgee, MD   2 years ago [place] Pickard, Cammie Mcgee, MD              Passed - Last BP in normal range    BP Readings from Last 1 Encounters:  05/14/22 126/76          pregabalin (LYRICA) 50 MG capsule  [Pharmacy Med Name: PREGABALIN 50 MG CAPSULE] 60 capsule 0    Sig: TAKE (1) CAPSULE BY MOUTH TWICE DAILY.     Not Delegated - Neurology:  Anticonvulsants - Controlled - pregabalin Failed - 07/25/2022  9:02 AM      Failed - This refill cannot be delegated      Passed - Cr in normal range and within 360 days    Creat  Date Value Ref Range Status  11/13/2021 0.95 0.60 - 1.29 mg/dL Final         Passed - Completed PHQ-2 or PHQ-9 in the last 360 days      Passed - Valid encounter within last 12 months    Recent Outpatient Visits           8 months ago Right flank pain   [place] Pickard, Cammie Mcgee, MD   1 year ago Special screening for malignant neoplasms, colon   [place] Pickard, Cammie Mcgee, MD   1 year ago Fatigue, unspecified type   [facility] Susy Frizzle, MD   2 years ago Pure hypercholesterolemia   [place] Pickard, Cammie Mcgee, MD   2 years ago [place] Pickard, Cammie Mcgee, MD                 Requested Prescriptions  Pending Prescriptions Disp Refills   hydrochlorothiazide (HYDRODIURIL) 25 MG tablet [Pharmacy Med Name: HYDROCHLOROTHIAZIDE 25 MG] 90 tablet 0    Sig: TAKE (1) TABLET BY MOUTH ONCE DAILY.     Cardiovascular: Diuretics - Thiazide Failed - 07/25/2022  9:02 AM      Failed - Cr in normal range and within 180 days    Creat  Date Value Ref Range Status  11/13/2021 0.95 0.60 - 1.29 mg/dL Final         Failed - K in normal range and within 180 days    Potassium  Date Value Ref Range Status  11/13/2021 3.7 3.5 - 5.3 mmol/L Final         Failed - Na in normal range and within 180 days    Sodium  Date Value Ref Range Status  11/13/2021 140 135 - 146 mmol/L Final         Failed - Valid encounter within last 6 months    Recent Outpatient Visits           8 months ago Right flank pain   [place] Susy Frizzle, MD   1 year  ago Special screening for malignant neoplasms, colon   [place] Pickard, Cammie Mcgee, MD   1 year ago Fatigue, unspecified type   [place] Pickard, Cammie Mcgee, MD   2 years ago Pure hypercholesterolemia   [place] Pickard, Cammie Mcgee, MD   2 years ago [place] Pickard, Cammie Mcgee, MD              Passed - Last BP in normal range    BP Readings from Last 1 Encounters:  05/14/22 126/76          pregabalin (LYRICA) 50 MG capsule [Pharmacy Med Name: PREGABALIN 50 MG CAPSULE] 60 capsule 0    Sig: TAKE (1) CAPSULE BY MOUTH TWICE DAILY.     Not Delegated - Neurology:  Anticonvulsants - Controlled - pregabalin Failed - 07/25/2022  9:02 AM      Failed - This refill cannot be delegated      Passed - Cr in normal range and within 360 days    Creat  Date Value Ref Range Status  11/13/2021 0.95 0.60 - 1.29 mg/dL Final         Passed - Completed PHQ-2 or PHQ-9 in the last 360 days      Passed - Valid encounter within last 12 months    Recent Outpatient Visits           8 months ago Right flank pain   [place] Dennard Schaumann, Cammie Mcgee, MD   1 year ago Special screening for malignant neoplasms, colon   [place] Susy Frizzle, MD   1 year ago Fatigue, unspecified type   [place] Susy Frizzle, MD   2 years ago Pure hypercholesterolemia   [place] Pickard, Cammie Mcgee, MD   2 years ago [place], Cammie Mcgee, MD

## 2022-07-25 NOTE — Telephone Encounter (Signed)
Pt called in to request a courtesy refill of this med hydrochlorothiazide (HYDRODIURIL) 25 MG tablet [[id number]] as pt states he has only a few days left and will not last until his scheduled appt with pcp on 08/01/22. Pt is asking if nurse will give a cb about other meds that he may be out of before his appt. Please advise.   Cb#: [phone]  Pharmacy: [place] [place]  [address], [place] [address]

## 2022-08-01 ENCOUNTER — Ambulatory Visit: Payer: BC Managed Care – PPO | Admitting: Family Medicine

## 2022-08-01 ENCOUNTER — Encounter: Payer: Self-pay | Admitting: Family Medicine

## 2022-08-01 VITALS — BP 124/76 | HR 98 | Ht 69.5 in | Wt 235.0 lb

## 2022-08-01 DIAGNOSIS — I10 Essential (primary) hypertension: Secondary | ICD-10-CM | POA: Diagnosis not present

## 2022-08-01 DIAGNOSIS — R7309 Other abnormal glucose: Secondary | ICD-10-CM | POA: Diagnosis not present

## 2022-08-01 NOTE — Progress Notes (Signed)
Subjective:    Patient ID: Stephen Allen, male    DOB: 08/27/1975, 47 y.o.   MRN: [id number]   Patient has a history of hypertension as well as fibromyalgia.  His last lab work was in April 2023 and at that time he had an elevated blood sugar.  His blood sugar was 126.  He is been going to the gym and exercising.  With exercising, his exercise capacity has improved.  He denies any chest pain or shortness of breath or dyspnea on exertion pressure today is excellent at 124/76.  He is taking hydrochlorothiazide.  He continues Cymbalta and Lyrica for fibromyalgia.  For breakthrough pain he uses tramadol.  For severe pain he will occasionally use hydrocodone however he is trying to use the medication sparingly.  He is avoiding meloxicam has a family friend recently died from a GI bleed Past Medical History:  Diagnosis Date   Arthritis    [employer] 04/03/2020   Asthma    Dx'd age 11   Asthma    [employer] 04/03/2020   Migraines    Obesity    Past Surgical History:  Procedure Laterality Date   HEMANGIOMA EXCISION  04/21/2006   SHOULDER SURGERY Left 07/22/2004   TOE SURGERY Left 06/2021   [facility] (2nd toe)   Current Outpatient Medications on File Prior to Visit  Medication Sig Dispense Refill   ALPRAZolam (XANAX) 0.5 MG tablet TAKE 1 TABLET BY MOUTH 3 TIMES A DAY AS NEEDED FOR ANXIETY. 30 tablet 0   DULoxetine (CYMBALTA) 30 MG capsule TAKE (1) CAPSULE BY MOUTH ONCE DAILY. 90 capsule 2   Glucos-Chondroit-Hyaluron-MSM (GLUCOSAMINE CHONDROITIN JOINT PO) Take by mouth.     hydrochlorothiazide (HYDRODIURIL) 25 MG tablet TAKE (1) TABLET BY MOUTH ONCE DAILY. 30 tablet 0   meloxicam (MOBIC) 15 MG tablet TAKE 1 TABLET BY MOUTH ONCE DAILY WITH A MEAL. 30 tablet 1   metoprolol succinate (TOPROL-XL) 50 MG 24 hr tablet TAKE (1) TABLET BY MOUTH ONCE DAILY. (Patient taking differently: Take 50 mg by mouth as needed.) 90 tablet 0   pregabalin (LYRICA) 50 MG capsule TAKE (1) CAPSULE BY MOUTH TWICE  DAILY. 60 capsule 0   sildenafil (VIAGRA) 100 MG tablet Take 0.5-1 tablets (50-100 mg total) by mouth daily as needed for erectile dysfunction. 5 tablet 0   SUMAtriptan (IMITREX) 100 MG tablet Take 1 tablet (100 mg total) by mouth every 2 (two) hours as needed for migraine. May repeat in 2 hours if headache persists or recurs. 10 tablet 0   traMADol (ULTRAM) 50 MG tablet TAKE (1) TABLET BY MOUTH EVERY SIX HOURS AS NEEDED. 90 tablet 0   No current facility-administered medications on file prior to visit.   Allergies  Allergen Reactions   Cortisone Other (See Comments)    Hx of retinal detachment   Albuterol     Migraine    Social History   Socioeconomic History   Marital status: Married    Spouse name: Not on file   Number of children: Not on file   Years of education: Not on file   Highest education level: Not on file  Occupational History   Not on file  Tobacco Use   Smoking status: Never   Smokeless tobacco: Never  Vaping Use   Vaping Use: Never used  Substance and Sexual Activity   Alcohol use: Yes    Comment: Rarely   Drug use: No   Sexual activity: Not on file  Other Topics  Concern   Not on file  Social History Narrative   Not on file   Social Determinants of Health   Financial Resource Strain: Not on file  Food Insecurity: Not on file  Transportation Needs: Not on file  Physical Activity: Not on file  Stress: Not on file  Social Connections: Not on file  Intimate Partner Violence: Not on file     Review of Systems  All other systems reviewed and are negative.      Objective:   Physical Exam Vitals reviewed.  Constitutional:      General: He is not in acute distress.    Appearance: Normal appearance. He is well-developed. He is obese. He is not diaphoretic.  Cardiovascular:     Rate and Rhythm: Normal rate and regular rhythm.     Heart sounds: Normal heart sounds. No murmur heard.    No friction rub. No gallop.  Pulmonary:     Effort: Pulmonary  effort is normal.     Breath sounds: Normal breath sounds.  Lymphadenopathy:     Cervical: No cervical adenopathy.  Neurological:     Mental Status: He is alert.           Assessment & Plan:  Elevated glucose - Plan: CBC with Differential/Platelet, COMPLETE METABOLIC PANEL WITH GFR, Hemoglobin A1c  Benign essential HTN Blood pressure today is excellent.  Check a CMP to monitor potassium and creatinine.  Potassium is stable we will continue hydrochlorothiazide.  I also like to get an A1c given the fact his last lab work showed an elevated blood sugar.  He is not fasting today so therefore we will use the A1c to see if there is any signs of prediabetes.

## 2022-08-02 LAB — CBC WITH DIFFERENTIAL/PLATELET
Absolute Monocytes: 405 cells/uL (ref 200–950)
Basophils Absolute: 32 cells/uL (ref 0–200)
Basophils Relative: 0.6 %
Eosinophils Absolute: 49 cells/uL (ref 15–500)
Eosinophils Relative: 0.9 %
HCT: 41.2 % (ref 38.5–50.0)
Hemoglobin: 14.7 g/dL (ref 13.2–17.1)
Lymphs Abs: 1561 cells/uL (ref 850–3900)
MCH: 30.4 pg (ref 27.0–33.0)
MCHC: 35.7 g/dL (ref 32.0–36.0)
MCV: 85.1 fL (ref 80.0–100.0)
MPV: 10.2 fL (ref 7.5–12.5)
Monocytes Relative: 7.5 %
Neutro Abs: 3353 cells/uL (ref 1500–7800)
Neutrophils Relative %: 62.1 %
Platelets: 284 10*3/uL (ref 140–400)
RBC: 4.84 10*6/uL (ref 4.20–5.80)
RDW: 13.1 % (ref 11.0–15.0)
Total Lymphocyte: 28.9 %
WBC: 5.4 10*3/uL (ref 3.8–10.8)

## 2022-08-02 LAB — COMPLETE METABOLIC PANEL WITH GFR
AG Ratio: 1.8 (calc) (ref 1.0–2.5)
ALT: 17 U/L (ref 9–46)
AST: 13 U/L (ref 10–40)
Albumin: 4.4 g/dL (ref 3.6–5.1)
Alkaline phosphatase (APISO): 84 U/L (ref 36–130)
BUN: 10 mg/dL (ref 7–25)
CO2: 31 mmol/L (ref 20–32)
Calcium: 9.6 mg/dL (ref 8.6–10.3)
Chloride: 101 mmol/L (ref 98–110)
Creat: 0.98 mg/dL (ref 0.60–1.29)
Globulin: 2.5 g/dL (calc) (ref 1.9–3.7)
Glucose, Bld: 100 mg/dL — ABNORMAL HIGH (ref 65–99)
Potassium: 4.1 mmol/L (ref 3.5–5.3)
Sodium: 140 mmol/L (ref 135–146)
Total Bilirubin: 0.4 mg/dL (ref 0.2–1.2)
Total Protein: 6.9 g/dL (ref 6.1–8.1)
eGFR: 96 mL/min/{1.73_m2} (ref >=60)

## 2022-08-02 LAB — HEMOGLOBIN A1C
Hgb A1c MFr Bld: 5.8 % of total Hgb — ABNORMAL HIGH (ref <5.7)
Mean Plasma Glucose: 120 mg/dL
eAG (mmol/L): 6.6 mmol/L

## 2022-08-23 ENCOUNTER — Other Ambulatory Visit: Payer: Self-pay | Admitting: Family Medicine

## 2022-08-23 DIAGNOSIS — R030 Elevated blood-pressure reading, without diagnosis of hypertension: Secondary | ICD-10-CM

## 2022-08-23 DIAGNOSIS — I10 Essential (primary) hypertension: Secondary | ICD-10-CM

## 2022-08-23 NOTE — Telephone Encounter (Signed)
Requested medications are due for refill today.  yes  Requested medications are on the active medications list.  yes  Last refill. 07/25/2022 #60 0 rf  Future visit scheduled.   no  Notes to clinic.  Refill not delegated.    Requested Prescriptions  Pending Prescriptions Disp Refills   pregabalin (LYRICA) 50 MG capsule [Pharmacy Med Name: PREGABALIN 50 MG CAPSULE] 60 capsule 0    Sig: TAKE (1) CAPSULE BY MOUTH TWICE DAILY.     Not Delegated - Neurology:  Anticonvulsants - Controlled - pregabalin Failed - 08/23/2022  1:18 PM      Failed - This refill cannot be delegated      Passed - Cr in normal range and within 360 days    Creat  Date Value Ref Range Status  08/01/2022 0.98 0.60 - 1.29 mg/dL Final         Passed - Completed PHQ-2 or PHQ-9 in the last 360 days      Passed - Valid encounter within last 12 months    Recent Outpatient Visits           9 months ago Right flank pain   [place] Schaumann, Cammie Mcgee, MD   1 year ago Special screening for malignant neoplasms, colon   [place] Pickard, Cammie Mcgee, MD   1 year ago Fatigue, unspecified type   [place] Pickard, Cammie Mcgee, MD   2 years ago Pure hypercholesterolemia   [place] Pickard, Cammie Mcgee, MD   2 years ago [place] Pickard, Cammie Mcgee, MD              Signed Prescriptions Disp Refills   hydrochlorothiazide (HYDRODIURIL) 25 MG tablet 90 tablet 1    Sig: TAKE 1 TABLET BY MOUTH ONCE DAILY.     Cardiovascular: Diuretics - Thiazide Failed - 08/23/2022  1:18 PM      Failed - Valid encounter within last 6 months    Recent Outpatient Visits           9 months ago Right flank pain   [place] Pickard, Cammie Mcgee, MD   1 year ago Special screening for malignant neoplasms, colon   [place] Susy Frizzle, MD   1 year ago Fatigue, unspecified type   [place] Susy Frizzle, MD   2 years ago Pure hypercholesterolemia   [place] [facility] Schaumann, Cammie Mcgee, MD   2 years ago [place] [facility]enn[facility] Schaumann, Cammie Mcgee, MD              Passed - Cr in normal range and within 180 days    Creat  Date Value Ref Range Status  08/01/2022 0.98 0.60 - 1.29 mg/dL Final         Passed - K in normal range and within 180 days    Potassium  Date Value Ref Range Status  08/01/2022 4.1 3.5 - 5.3 mmol/L Final         Passed - Na in normal range and within 180 days    Sodium  Date Value Ref Range Status  08/01/2022 140 135 - 146 mmol/L Final         Passed - Last BP in normal range    BP Readings from Last 1 Encounters:  08/01/22 124/76

## 2022-08-23 NOTE — Telephone Encounter (Signed)
Requested Prescriptions  Pending Prescriptions Disp Refills   hydrochlorothiazide (HYDRODIURIL) 25 MG tablet [Pharmacy Med Name: HYDROCHLOROTHIAZIDE 25 MG] 90 tablet 1    Sig: TAKE 1 TABLET BY MOUTH ONCE DAILY.     Cardiovascular: Diuretics - Thiazide Failed - 08/23/2022  1:18 PM      Failed - Valid encounter within last 6 months    Recent Outpatient Visits           9 months ago Right flank pain   [place] Pickard, Cammie Mcgee, MD   1 year ago Special screening for malignant neoplasms, colon   [place] Susy Frizzle, MD   1 year ago Fatigue, unspecified type   [place] Susy Frizzle, MD   2 years ago Pure hypercholesterolemia   [place] [facility] Schaumann, Cammie Mcgee, MD   2 years ago [place] Dennard Schaumann, Cammie Mcgee, MD              Passed - Cr in normal range and within 180 days    Creat  Date Value Ref Range Status  08/01/2022 0.98 0.60 - 1.29 mg/dL Final         Passed - K in normal range and within 180 days    Potassium  Date Value Ref Range Status  08/01/2022 4.1 3.5 - 5.3 mmol/L Final         Passed - Na in normal range and within 180 days    Sodium  Date Value Ref Range Status  08/01/2022 140 135 - 146 mmol/L Final         Passed - Last BP in normal range    BP Readings from Last 1 Encounters:  08/01/22 124/76          pregabalin (LYRICA) 50 MG capsule [Pharmacy Med Name: PREGABALIN 50 MG CAPSULE] 60 capsule 0    Sig: TAKE (1) CAPSULE BY MOUTH TWICE DAILY.     Not Delegated - Neurology:  Anticonvulsants - Controlled - pregabalin Failed - 08/23/2022  1:18 PM      Failed - This refill cannot be delegated      Passed - Cr in normal range and within 360 days    Creat  Date Value Ref Range Status  08/01/2022 0.98 0.60 - 1.29 mg/dL Final         Passed - Completed PHQ-2 or PHQ-9 in the last 360 days      Passed - Valid encounter within last 12 months     Recent Outpatient Visits           9 months ago Right flank pain   [place] Pickard, Cammie Mcgee, MD   1 year ago Special screening for malignant neoplasms, colon   [place] Susy Frizzle, MD   1 year ago Fatigue, unspecified type   [place] Susy Frizzle, MD   2 years ago Pure hypercholesterolemia   [place] Pickard, Cammie Mcgee, MD   2 years ago [place], Cammie Mcgee, MD

## 2022-09-10 ENCOUNTER — Other Ambulatory Visit: Payer: Self-pay | Admitting: Family Medicine

## 2022-09-26 ENCOUNTER — Ambulatory Visit: Payer: BC Managed Care – PPO | Admitting: Podiatry

## 2022-09-26 ENCOUNTER — Encounter: Payer: Self-pay | Admitting: Podiatry

## 2022-09-26 ENCOUNTER — Ambulatory Visit (INDEPENDENT_AMBULATORY_CARE_PROVIDER_SITE_OTHER): Payer: BC Managed Care – PPO

## 2022-09-26 ENCOUNTER — Other Ambulatory Visit: Payer: Self-pay | Admitting: Podiatry

## 2022-09-26 DIAGNOSIS — M778 Other enthesopathies, not elsewhere classified: Secondary | ICD-10-CM

## 2022-09-26 NOTE — Progress Notes (Signed)
He presents today chief complaint of painful hallux left.  States that he has a sharp sudden intermittent pain over the past few days states that he was walking last week and it just started hurting he says it was really bad over the weekend did not notice any redness or swelling just pain in the toe.  He denies foreign body denies any injury.  Does relate that he has improved considerably over the past few days.  Objective: Pulses are palpable.  There is no erythema edema cellulitis drainage or odor he has 1 area that does demonstrate a very small spot on the tip of the toe laterally it appears that there may have been something that entered the toe and came out but radiographically there is no bony abnormality other than a transverse line that may be indicating some bone edema but palpation does not demonstrate any significant bony edema.  The area is tender on palpation within the tissues but not deep to the bone.  Assessment: Possible foreign body this not visible at this point most likely this will continue.  Plan: Instructed him to watch for signs of this working his way out including a small pustule which she will notify us if he needs assistance with this.  If this were to continue he will give [place] call and we will reassess.

## 2022-10-08 ENCOUNTER — Other Ambulatory Visit: Payer: Self-pay | Admitting: Family Medicine

## 2022-10-26 ENCOUNTER — Other Ambulatory Visit: Payer: Self-pay | Admitting: Family Medicine

## 2022-10-28 NOTE — Telephone Encounter (Signed)
Requested medication (s) are due for refill today: yes  Requested medication (s) are on the active medication list: yes  Last refill:  09/10/22  Future visit scheduled: no  Notes to clinic:  Unable to refill per protocol, cannot delegate.      Requested Prescriptions  Pending Prescriptions Disp Refills   pregabalin (LYRICA) 50 MG capsule [Pharmacy Med Name: PREGABALIN 50 MG CAPSULE] 60 capsule 0    Sig: TAKE (1) CAPSULE BY MOUTH TWICE DAILY.     Not Delegated - Neurology:  Anticonvulsants - Controlled - pregabalin Failed - 10/26/2022  8:32 AM      Failed - This refill cannot be delegated      Passed - Cr in normal range and within 360 days    Creat  Date Value Ref Range Status  08/01/2022 0.98 0.60 - 1.29 mg/dL Final         Passed - Completed PHQ-2 or PHQ-9 in the last 360 days      Passed - Valid encounter within last 12 months    Recent Outpatient Visits           11 months ago Right flank pain   [facility] Pickard, Priscille Heidelberg, MD   1 year ago Special screening for malignant neoplasms, colon   [facility] Donita Brooks, MD   1 year ago Fatigue, unspecified type   [facility] Donita Brooks, MD   2 years ago Pure hypercholesterolemia   [facility] Pickard, Priscille Heidelberg, MD   2 years ago Myalgia   [facility] Pickard, Priscille Heidelberg, MD

## 2022-11-22 ENCOUNTER — Other Ambulatory Visit: Payer: Self-pay | Admitting: Family Medicine

## 2022-12-10 ENCOUNTER — Other Ambulatory Visit: Payer: Self-pay | Admitting: Family Medicine

## 2022-12-11 NOTE — Telephone Encounter (Signed)
Requested medication (s) are due for refill today: yes  Requested medication (s) are on the active medication list: yes    Last refill: 10/08/22  #90  0 refills  Future visit scheduled no  Notes to clinic:Not delegated, please review. Thank you.  Requested Prescriptions  Pending Prescriptions Disp Refills   traMADol (ULTRAM) 50 MG tablet [Pharmacy Med Name: TRAMADOL HCL 50 MG TABLET] 90 tablet 0    Sig: TAKE (1) TABLET BY MOUTH EVERY SIX HOURS AS NEEDED.     There is no refill protocol information for this order

## 2022-12-13 ENCOUNTER — Other Ambulatory Visit: Payer: Self-pay | Admitting: Family Medicine

## 2022-12-17 ENCOUNTER — Other Ambulatory Visit: Payer: Self-pay | Admitting: Family Medicine

## 2023-01-02 ENCOUNTER — Other Ambulatory Visit: Payer: Self-pay | Admitting: Student

## 2023-01-02 ENCOUNTER — Other Ambulatory Visit: Payer: Self-pay | Admitting: Nurse Practitioner

## 2023-01-03 NOTE — Telephone Encounter (Signed)
Requested medication (s) are due for refill today: yes  Requested medication (s) are on the active medication list: yes  Last refill:  08/23/21  Future visit scheduled: no  Notes to clinic:  Unable to refill per protocol, cannot delegate.      Requested Prescriptions  Pending Prescriptions Disp Refills   ALPRAZolam (XANAX) 0.5 MG tablet [Pharmacy Med Name: ALPRAZOLAM 0.5 MG TABLET] 30 tablet 0    Sig: TAKE 1 TABLET BY MOUTH 3 TIMES A DAY AS NEEDED FOR ANXIETY.     Not Delegated - Psychiatry: Anxiolytics/Hypnotics 2 Failed - 01/02/2023  3:02 PM      Failed - This refill cannot be delegated      Failed - Urine Drug Screen completed in last 360 days      Failed - Valid encounter within last 6 months    Recent Outpatient Visits           1 year ago Right flank pain   [facility] Tanya Nones, Priscille Heidelberg, MD   1 year ago Special screening for malignant neoplasms, colon   [facility] Donita Brooks, MD   1 year ago Fatigue, unspecified type   [facility] Donita Brooks, MD   2 years ago Pure hypercholesterolemia   [facility] Pickard, Priscille Heidelberg, MD   2 years ago Myalgia   [facility] Pickard, Priscille Heidelberg, MD              Passed - Patient is not pregnant

## 2023-01-23 ENCOUNTER — Other Ambulatory Visit: Payer: Self-pay | Admitting: Family Medicine

## 2023-01-24 NOTE — Telephone Encounter (Signed)
Requested medication (s) are due for refill today:yes  Requested medication (s) are on the active medication list: yes  Last refill:  12/12/22 #90  Future visit scheduled: no  Notes to clinic:  med not assigned to a protocol and not delegated to NT to RF   Requested Prescriptions  Pending Prescriptions Disp Refills   traMADol (ULTRAM) 50 MG tablet [Pharmacy Med Name: TRAMADOL HCL 50 MG TABLET] 90 tablet 0    Sig: TAKE (1) TABLET BY MOUTH EVERY SIX HOURS AS NEEDED.     There is no refill protocol information for this order

## 2023-01-27 ENCOUNTER — Other Ambulatory Visit: Payer: Self-pay | Admitting: Family Medicine

## 2023-02-17 ENCOUNTER — Ambulatory Visit (INDEPENDENT_AMBULATORY_CARE_PROVIDER_SITE_OTHER): Payer: BC Managed Care – PPO

## 2023-02-17 ENCOUNTER — Other Ambulatory Visit: Payer: Self-pay | Admitting: Family Medicine

## 2023-02-17 DIAGNOSIS — M778 Other enthesopathies, not elsewhere classified: Secondary | ICD-10-CM

## 2023-02-17 DIAGNOSIS — M2042 Other hammer toe(s) (acquired), left foot: Secondary | ICD-10-CM | POA: Diagnosis not present

## 2023-02-17 DIAGNOSIS — Z9889 Other specified postprocedural states: Secondary | ICD-10-CM

## 2023-02-17 DIAGNOSIS — I10 Essential (primary) hypertension: Secondary | ICD-10-CM

## 2023-02-17 DIAGNOSIS — M67472 Ganglion, left ankle and foot: Secondary | ICD-10-CM | POA: Diagnosis not present

## 2023-02-17 DIAGNOSIS — R030 Elevated blood-pressure reading, without diagnosis of hypertension: Secondary | ICD-10-CM

## 2023-02-17 NOTE — Progress Notes (Signed)
Patient was present for eval. of CFO's patient has HX of ganglion Cyst left foot with removal also hammer toes BIL latest was Capsulitis with pain Left Great toe  Patient will benefit from custom foot orthotics to increase MLA support help balance body weight more evenly to greater reduce pressure and pain   Scans taken today / order sent to [facility]  Items to be fit when in   [employer], CFo, CFm

## 2023-03-17 ENCOUNTER — Ambulatory Visit: Payer: BC Managed Care – PPO

## 2023-03-17 NOTE — Progress Notes (Signed)
Patient presents today to pick up custom molded foot orthotics, diagnosed with   HX of ganglion Cyst left foot with removal also hammer toes BIL latest was Capsulitis with pain Left Great toe     by Dr. Al Corpus.   Orthotics were dispensed and fit was satisfactory. Reviewed instructions for break-in and wear. Written instructions given to patient.  Patient will follow up as needed.   Addison Bailey Cped, CFo, CFm

## 2023-03-21 ENCOUNTER — Other Ambulatory Visit: Payer: Self-pay | Admitting: Family Medicine

## 2023-03-21 DIAGNOSIS — R030 Elevated blood-pressure reading, without diagnosis of hypertension: Secondary | ICD-10-CM

## 2023-03-21 DIAGNOSIS — I10 Essential (primary) hypertension: Secondary | ICD-10-CM

## 2023-04-10 ENCOUNTER — Other Ambulatory Visit: Payer: Self-pay | Admitting: Family Medicine

## 2023-04-11 NOTE — Telephone Encounter (Signed)
Requested Prescriptions  Pending Prescriptions Disp Refills   meloxicam (MOBIC) 15 MG tablet [Pharmacy Med Name: meloxicam 15 mg tablet] 30 tablet 2    Sig: TAKE 1 TABLET BY MOUTH ONCE DAILY WITH A MEAL.     Analgesics:  COX2 Inhibitors Failed - 04/10/2023  9:19 AM      Failed - Manual Review: Labs are only required if the patient has taken medication for more than 8 weeks.      Failed - Valid encounter within last 12 months    Recent Outpatient Visits           1 year ago Right flank pain   [facility] Tanya Nones, Priscille Heidelberg, MD   2 years ago Special screening for malignant neoplasms, colon   [facility] Donita Brooks, MD   2 years ago Fatigue, unspecified type   [facility] Pickard, Priscille Heidelberg, MD   3 years ago Pure hypercholesterolemia   [facility] Pickard, Priscille Heidelberg, MD   3 years ago Myalgia   [facility] Pickard, Priscille Heidelberg, MD              Passed - HGB in normal range and within 360 days    Hemoglobin  Date Value Ref Range Status  08/01/2022 14.7 13.2 - 17.1 g/dL Final         Passed - Cr in normal range and within 360 days    Creat  Date Value Ref Range Status  08/01/2022 0.98 0.60 - 1.29 mg/dL Final         Passed - HCT in normal range and within 360 days    HCT  Date Value Ref Range Status  08/01/2022 41.2 38.5 - 50.0 % Final         Passed - AST in normal range and within 360 days    AST  Date Value Ref Range Status  08/01/2022 13 10 - 40 U/L Final         Passed - ALT in normal range and within 360 days    ALT  Date Value Ref Range Status  08/01/2022 17 9 - 46 U/L Final         Passed - eGFR is 30 or above and within 360 days    GFR, Est African American  Date Value Ref Range Status  02/29/2020 110 > OR = 60 mL/min/1.81m2 Final   GFR, Est Non African American  Date Value Ref Range Status  02/29/2020 95 > OR = 60 mL/min/1.43m2 Final   eGFR  Date Value Ref Range  Status  08/01/2022 96 > OR = 60 mL/min/1.42m2 Final         Passed - Patient is not pregnant

## 2023-04-16 ENCOUNTER — Other Ambulatory Visit: Payer: Self-pay | Admitting: Family Medicine

## 2023-04-16 DIAGNOSIS — I10 Essential (primary) hypertension: Secondary | ICD-10-CM

## 2023-04-16 DIAGNOSIS — R030 Elevated blood-pressure reading, without diagnosis of hypertension: Secondary | ICD-10-CM

## 2023-04-17 NOTE — Telephone Encounter (Signed)
Requested medication (s) are due for refill today: yes   Requested medication (s) are on the active medication list: yes   Last refill:  lyrica-03/21/23 #60 0 refills, tramadol-01/24/23 #90 0 refills, hydrodiuril- 03/21/23 #30 0 refills  Future visit scheduled: no   Notes to clinic:  last OV 08/01/22. Lyrica, tramadol- not delegated per protocol. Do you want to refill? Do you want to give 90 day supply for hydrodiuril?     Requested Prescriptions  Pending Prescriptions Disp Refills   pregabalin (LYRICA) 50 MG capsule [Pharmacy Med Name: pregabalin 50 mg capsule] 60 capsule 0    Sig: TAKE (1) CAPSULE BY MOUTH TWICE DAILY.     Not Delegated - Neurology:  Anticonvulsants - Controlled - pregabalin Failed - 04/17/2023 10:12 AM      Failed - This refill cannot be delegated      Failed - Valid encounter within last 12 months    Recent Outpatient Visits           1 year ago Right flank pain   [facility] Tanya Nones, Priscille Heidelberg, MD   2 years ago Special screening for malignant neoplasms, colon   [facility] Pickard, Priscille Heidelberg, MD   2 years ago Fatigue, unspecified type   [facility] Pickard, Priscille Heidelberg, MD   3 years ago Pure hypercholesterolemia   [facility] Pickard, Priscille Heidelberg, MD   3 years ago Myalgia   [facility] Pickard, Priscille Heidelberg, MD              Passed - Cr in normal range and within 360 days    Creat  Date Value Ref Range Status  08/01/2022 0.98 0.60 - 1.29 mg/dL Final         Passed - Completed PHQ-2 or PHQ-9 in the last 360 days       traMADol (ULTRAM) 50 MG tablet [Pharmacy Med Name: tramadol 50 mg tablet] 90 tablet 0    Sig: TAKE (1) TABLET BY MOUTH EVERY SIX HOURS AS NEEDED.     Not Delegated - Analgesics:  Opioid Agonists Failed - 04/17/2023 10:12 AM      Failed - This refill cannot be delegated      Failed - Urine Drug Screen completed in last 360 days      Failed - Valid encounter  within last 3 months    Recent Outpatient Visits           1 year ago Right flank pain   [facility] Tanya Nones, Priscille Heidelberg, MD   2 years ago Special screening for malignant neoplasms, colon   [facility] Donita Brooks, MD   2 years ago Fatigue, unspecified type   [facility] Pickard, Priscille Heidelberg, MD   3 years ago Pure hypercholesterolemia   [facility] Pickard, Priscille Heidelberg, MD   3 years ago Myalgia   [facility] Tanya Nones, Priscille Heidelberg, MD               hydrochlorothiazide (HYDRODIURIL) 25 MG tablet [Pharmacy Med Name: hydrochlorothiazide 25 mg tablet] 30 tablet 0    Sig: TAKE 1 TABLET BY MOUTH ONCE DAILY.     Cardiovascular: Diuretics - Thiazide Failed - 04/17/2023 10:12 AM      Failed - Cr in normal range and within 180 days    Creat  Date Value Ref Range Status  08/01/2022 0.98 0.60 -  1.29 mg/dL Final         Failed - K in normal range and within 180 days    Potassium  Date Value Ref Range Status  08/01/2022 4.1 3.5 - 5.3 mmol/L Final         Failed - Na in normal range and within 180 days    Sodium  Date Value Ref Range Status  08/01/2022 140 135 - 146 mmol/L Final         Failed - Valid encounter within last 6 months    Recent Outpatient Visits           1 year ago Right flank pain   [facility] Donita Brooks, MD   2 years ago Special screening for malignant neoplasms, colon   [facility] Donita Brooks, MD   2 years ago Fatigue, unspecified type   [facility] Pickard, Priscille Heidelberg, MD   3 years ago Pure hypercholesterolemia   [facility] Pickard, Priscille Heidelberg, MD   3 years ago Myalgia   [facility] Pickard, Priscille Heidelberg, MD              Passed - Last BP in normal range    BP Readings from Last 1 Encounters:  08/01/22 124/76

## 2023-04-25 ENCOUNTER — Other Ambulatory Visit: Payer: Self-pay

## 2023-04-25 ENCOUNTER — Other Ambulatory Visit: Payer: Self-pay | Admitting: Family Medicine

## 2023-04-25 ENCOUNTER — Encounter: Payer: Self-pay | Admitting: Family Medicine

## 2023-04-25 ENCOUNTER — Telehealth: Payer: Self-pay

## 2023-04-25 DIAGNOSIS — R030 Elevated blood-pressure reading, without diagnosis of hypertension: Secondary | ICD-10-CM

## 2023-04-25 DIAGNOSIS — I10 Essential (primary) hypertension: Secondary | ICD-10-CM

## 2023-04-25 MED ORDER — TRAMADOL HCL 50 MG PO TABS: 50.0000 mg | ORAL_TABLET | Freq: Four times a day (QID) | ORAL | 0 refills | Status: DC | PRN

## 2023-04-25 MED ORDER — HYDROCHLOROTHIAZIDE 25 MG PO TABS
ORAL_TABLET | ORAL | 0 refills | Status: DC
Start: 2023-04-25 — End: 2023-05-26

## 2023-04-25 MED ORDER — PREGABALIN 50 MG PO CAPS: 50.0000 mg | ORAL_CAPSULE | Freq: Two times a day (BID) | ORAL | 0 refills | Status: DC

## 2023-04-25 NOTE — Telephone Encounter (Signed)
Pt called in stating that he requested a refill of his bp meds 3 weeks ago and is now completely out of all bp meds. Pt is asking for courtesy refill of these meds. Pt does have a medcheck f/u appt Thurs 05/01/23 with pcp. Pt is going to [facility] to help with [facility] and would like to have these filled by today if possible please.   hydrochlorothiazide (HYDRODIURIL) 25 MG tablet [[id number]] pregabalin (LYRICA) 50 MG capsule [[id number]] traMADol (ULTRAM) 50 MG tablet [[id number]]    Cb#: [phone]

## 2023-05-01 ENCOUNTER — Encounter: Payer: Self-pay | Admitting: Family Medicine

## 2023-05-01 ENCOUNTER — Other Ambulatory Visit: Payer: Self-pay | Admitting: Family Medicine

## 2023-05-01 ENCOUNTER — Ambulatory Visit: Payer: BC Managed Care – PPO | Admitting: Family Medicine

## 2023-05-01 VITALS — BP 138/80 | HR 80 | Temp 97.6°F | Ht 69.5 in | Wt 244.0 lb

## 2023-05-01 DIAGNOSIS — I10 Essential (primary) hypertension: Secondary | ICD-10-CM

## 2023-05-01 DIAGNOSIS — Z1211 Encounter for screening for malignant neoplasm of colon: Secondary | ICD-10-CM | POA: Diagnosis not present

## 2023-05-01 DIAGNOSIS — R7303 Prediabetes: Secondary | ICD-10-CM

## 2023-05-01 MED ORDER — DULOXETINE HCL 30 MG PO CPEP: 30.0000 mg | ORAL_CAPSULE | Freq: Two times a day (BID) | ORAL | 3 refills | Status: DC

## 2023-05-01 NOTE — Progress Notes (Signed)
Subjective:    Patient ID: Stephen Allen, male    DOB: 30-Jan-1976, 47 y.o.   MRN: [id number]   Patient has a history of hypertension as well as fibromyalgia.  He is currently taking Cymbalta 30 g a day, Lyrica twice daily, tramadol twice daily for diffuse joint pain and body pains related to his fibromyalgia.  This seems to be working well for him.  At his last lab visit, he was found to have mild prediabetes.  Unfortunately he is still drinking sodas.  He denies any chest pain shortness of breath or dyspnea on exertion.  He is due for colon cancer screening. Past Medical History:  Diagnosis Date   Arthritis    [employer] 04/03/2020   Asthma    Dx'd age 22   Asthma    [employer] 04/03/2020   Migraines    Obesity    Past Surgical History:  Procedure Laterality Date   HEMANGIOMA EXCISION  04/21/2006   SHOULDER SURGERY Left 07/22/2004   TOE SURGERY Left 06/2021   [facility] (2nd toe)   Current Outpatient Medications on File Prior to Visit  Medication Sig Dispense Refill   ALPRAZolam (XANAX) 0.5 MG tablet TAKE 1 TABLET BY MOUTH 3 TIMES A DAY AS NEEDED FOR ANXIETY. 30 tablet 0   DULoxetine (CYMBALTA) 30 MG capsule TAKE (1) CAPSULE BY MOUTH ONCE DAILY. 90 capsule 1   Glucos-Chondroit-Hyaluron-MSM (GLUCOSAMINE CHONDROITIN JOINT PO) Take by mouth.     hydrochlorothiazide (HYDRODIURIL) 25 MG tablet TAKE 1 TABLET BY MOUTH ONCE DAILY. 30 tablet 0   HYDROcodone-acetaminophen (NORCO/VICODIN) 5-325 MG tablet Take 1 tablet by mouth 3 (three) times daily as needed.     meloxicam (MOBIC) 15 MG tablet TAKE 1 TABLET BY MOUTH ONCE DAILY WITH A MEAL. 30 tablet 2   metoprolol succinate (TOPROL-XL) 50 MG 24 hr tablet TAKE (1) TABLET BY MOUTH ONCE DAILY. (Patient taking differently: Take 50 mg by mouth as needed.) 90 tablet 0   pregabalin (LYRICA) 50 MG capsule Take 1 capsule (50 mg total) by mouth 2 (two) times daily. 60 capsule 0   sildenafil (VIAGRA) 100 MG tablet Take 0.5-1 tablets (50-100 mg  total) by mouth daily as needed for erectile dysfunction. 5 tablet 0   traMADol (ULTRAM) 50 MG tablet Take 1 tablet (50 mg total) by mouth every 6 (six) hours as needed. 90 tablet 0   No current facility-administered medications on file prior to visit.   Allergies  Allergen Reactions   Cortisone Other (See Comments)    Hx of retinal detachment   Albuterol     Migraine    Social History   Socioeconomic History   Marital status: Married    Spouse name: Not on file   Number of children: Not on file   Years of education: Not on file   Highest education level: Not on file  Occupational History   Not on file  Tobacco Use   Smoking status: Never   Smokeless tobacco: Never  Vaping Use   Vaping status: Never Used  Substance and Sexual Activity   Alcohol use: Yes    Comment: Rarely   Drug use: No   Sexual activity: Not on file  Other Topics Concern   Not on file  Social History Narrative   Not on file   Social Determinants of Health   Financial Resource Strain: Not on file  Food Insecurity: Not on file  Transportation Needs: Not on file  Physical Activity: Not on  file  Stress: Not on file  Social Connections: Not on file  Intimate Partner Violence: Not on file     Review of Systems  All other systems reviewed and are negative.      Objective:   Physical Exam Vitals reviewed.  Constitutional:      General: He is not in acute distress.    Appearance: Normal appearance. He is well-developed. He is obese. He is not diaphoretic.  Cardiovascular:     Rate and Rhythm: Normal rate and regular rhythm.     Heart sounds: Normal heart sounds. No murmur heard.    No friction rub. No gallop.  Pulmonary:     Effort: Pulmonary effort is normal.     Breath sounds: Normal breath sounds.  Lymphadenopathy:     Cervical: No cervical adenopathy.  Neurological:     Mental Status: He is alert.           Assessment & Plan:  Benign essential HTN - Plan: Hemoglobin A1c, CBC  with Differential/Platelet, COMPLETE METABOLIC PANEL WITH GFR, Lipid panel  Prediabetes - Plan: Hemoglobin A1c, CBC with Differential/Platelet, COMPLETE METABOLIC PANEL WITH GFR, Lipid panel  Colon cancer screening - Plan: Cologuard Patient's blood pressure is excellent.  I will schedule the patient for Cologuard to screen for colon cancer.  I would like to check a CMP, CBC, lipid panel, and an A1c to monitor the patient's prediabetic condition.  Goal LDL cholesterol is less than 100.  Patient declines a flu shot.  Recommend increasing Cymbalta 30 g twice daily to see if that helps her manage fibromyalgia pain

## 2023-05-02 LAB — COMPLETE METABOLIC PANEL WITH GFR
AG Ratio: 1.8 (calc) (ref 1.0–2.5)
ALT: 18 U/L (ref 9–46)
AST: 15 U/L (ref 10–40)
Albumin: 4.5 g/dL (ref 3.6–5.1)
Alkaline phosphatase (APISO): 74 U/L (ref 36–130)
BUN: 10 mg/dL (ref 7–25)
CO2: 31 mmol/L (ref 20–32)
Calcium: 9.7 mg/dL (ref 8.6–10.3)
Chloride: 101 mmol/L (ref 98–110)
Creat: 1.04 mg/dL (ref 0.60–1.29)
Globulin: 2.5 g/dL (ref 1.9–3.7)
Glucose, Bld: 106 mg/dL — ABNORMAL HIGH (ref 65–99)
Potassium: 3.9 mmol/L (ref 3.5–5.3)
Sodium: 139 mmol/L (ref 135–146)
Total Bilirubin: 0.4 mg/dL (ref 0.2–1.2)
Total Protein: 7 g/dL (ref 6.1–8.1)
eGFR: 89 mL/min/{1.73_m2} (ref >=60)

## 2023-05-02 LAB — CBC WITH DIFFERENTIAL/PLATELET
Absolute Monocytes: 568 {cells}/uL (ref 200–950)
Basophils Absolute: 41 {cells}/uL (ref 0–200)
Basophils Relative: 0.7 %
Eosinophils Absolute: 220 {cells}/uL (ref 15–500)
Eosinophils Relative: 3.8 %
HCT: 45.9 % (ref 38.5–50.0)
Hemoglobin: 15.1 g/dL (ref 13.2–17.1)
Lymphs Abs: 2018 {cells}/uL (ref 850–3900)
MCH: 29.4 pg (ref 27.0–33.0)
MCHC: 32.9 g/dL (ref 32.0–36.0)
MCV: 89.5 fL (ref 80.0–100.0)
MPV: 10.1 fL (ref 7.5–12.5)
Monocytes Relative: 9.8 %
Neutro Abs: 2952 {cells}/uL (ref 1500–7800)
Neutrophils Relative %: 50.9 %
Platelets: 301 10*3/uL (ref 140–400)
RBC: 5.13 10*6/uL (ref 4.20–5.80)
RDW: 13.1 % (ref 11.0–15.0)
Total Lymphocyte: 34.8 %
WBC: 5.8 10*3/uL (ref 3.8–10.8)

## 2023-05-02 LAB — HEMOGLOBIN A1C
Hgb A1c MFr Bld: 5.7 %{Hb} — ABNORMAL HIGH (ref <5.7)
Mean Plasma Glucose: 117 mg/dL
eAG (mmol/L): 6.5 mmol/L

## 2023-05-02 LAB — LIPID PANEL
Cholesterol: 232 mg/dL — ABNORMAL HIGH (ref <200)
HDL: 43 mg/dL (ref >=40)
LDL Cholesterol (Calc): 136 mg/dL — ABNORMAL HIGH
Non-HDL Cholesterol (Calc): 189 mg/dL — ABNORMAL HIGH (ref <130)
Total CHOL/HDL Ratio: 5.4 (calc) — ABNORMAL HIGH (ref <5.0)
Triglycerides: 369 mg/dL — ABNORMAL HIGH (ref <150)

## 2023-05-08 ENCOUNTER — Other Ambulatory Visit: Payer: Self-pay

## 2023-05-08 MED ORDER — SILDENAFIL CITRATE 100 MG PO TABS: 50.0000 mg | ORAL_TABLET | Freq: Every day | ORAL | 0 refills | Status: DC | PRN

## 2023-05-08 MED ORDER — ROSUVASTATIN CALCIUM 10 MG PO TABS: 10.0000 mg | ORAL_TABLET | Freq: Every day | ORAL | 3 refills | Status: DC

## 2023-05-21 ENCOUNTER — Encounter: Payer: Self-pay | Admitting: Family Medicine

## 2023-05-22 ENCOUNTER — Other Ambulatory Visit: Payer: Self-pay

## 2023-05-22 DIAGNOSIS — M797 Fibromyalgia: Secondary | ICD-10-CM

## 2023-05-22 MED ORDER — DULOXETINE HCL 30 MG PO CPEP: 30.0000 mg | ORAL_CAPSULE | Freq: Two times a day (BID) | ORAL | 1 refills | Status: DC

## 2023-05-24 ENCOUNTER — Other Ambulatory Visit: Payer: Self-pay | Admitting: Family Medicine

## 2023-05-24 DIAGNOSIS — I10 Essential (primary) hypertension: Secondary | ICD-10-CM

## 2023-05-24 DIAGNOSIS — R030 Elevated blood-pressure reading, without diagnosis of hypertension: Secondary | ICD-10-CM

## 2023-05-26 ENCOUNTER — Other Ambulatory Visit: Payer: Self-pay | Admitting: Family Medicine

## 2023-05-26 DIAGNOSIS — I10 Essential (primary) hypertension: Secondary | ICD-10-CM

## 2023-05-26 DIAGNOSIS — R030 Elevated blood-pressure reading, without diagnosis of hypertension: Secondary | ICD-10-CM

## 2023-05-26 NOTE — Telephone Encounter (Signed)
Last OV 05/01/23 within protocol.  Requested Prescriptions  Pending Prescriptions Disp Refills   hydrochlorothiazide (HYDRODIURIL) 25 MG tablet [Pharmacy Med Name: hydrochlorothiazide 25 mg tablet] 90 tablet 0    Sig: TAKE 1 TABLET BY MOUTH ONCE DAILY.     Cardiovascular: Diuretics - Thiazide Failed - 05/24/2023  1:39 PM      Failed - Valid encounter within last 6 months    Recent Outpatient Visits           1 year ago Right flank pain   [facility] Tanya Nones, Priscille Heidelberg, MD   2 years ago Special screening for malignant neoplasms, colon   [facility] Donita Brooks, MD   2 years ago Fatigue, unspecified type   [facility] Donita Brooks, MD   3 years ago Pure hypercholesterolemia   [facility] Pickard, Priscille Heidelberg, MD   3 years ago Myalgia   [facility] Tanya Nones, Priscille Heidelberg, MD              Passed - Cr in normal range and within 180 days    Creat  Date Value Ref Range Status  05/01/2023 1.04 0.60 - 1.29 mg/dL Final         Passed - K in normal range and within 180 days    Potassium  Date Value Ref Range Status  05/01/2023 3.9 3.5 - 5.3 mmol/L Final         Passed - Na in normal range and within 180 days    Sodium  Date Value Ref Range Status  05/01/2023 139 135 - 146 mmol/L Final         Passed - Last BP in normal range    BP Readings from Last 1 Encounters:  05/01/23 138/80

## 2023-05-27 ENCOUNTER — Other Ambulatory Visit: Payer: Self-pay

## 2023-05-27 ENCOUNTER — Other Ambulatory Visit: Payer: Self-pay | Admitting: Family Medicine

## 2023-05-27 MED ORDER — PREGABALIN 50 MG PO CAPS: 50.0000 mg | ORAL_CAPSULE | Freq: Two times a day (BID) | ORAL | 0 refills | Status: DC

## 2023-05-27 MED ORDER — PREGABALIN 50 MG PO CAPS: 50.0000 mg | ORAL_CAPSULE | Freq: Two times a day (BID) | ORAL | 5 refills | Status: DC

## 2023-05-27 NOTE — Telephone Encounter (Signed)
Duplicate request, refilled 05/26/23 Requested Prescriptions  Pending Prescriptions Disp Refills   hydrochlorothiazide (HYDRODIURIL) 25 MG tablet [Pharmacy Med Name: hydrochlorothiazide 25 mg tablet] 30 tablet 0    Sig: TAKE 1 TABLET BY MOUTH ONCE DAILY.     Cardiovascular: Diuretics - Thiazide Failed - 05/26/2023  5:28 PM      Failed - Valid encounter within last 6 months    Recent Outpatient Visits           1 year ago Right flank pain   [facility] Tanya Nones, Priscille Heidelberg, MD   2 years ago Special screening for malignant neoplasms, colon   [facility] Donita Brooks, MD   2 years ago Fatigue, unspecified type   [facility] Donita Brooks, MD   3 years ago Pure hypercholesterolemia   [facility] Pickard, Priscille Heidelberg, MD   3 years ago Myalgia   [facility] Tanya Nones, Priscille Heidelberg, MD              Passed - Cr in normal range and within 180 days    Creat  Date Value Ref Range Status  05/01/2023 1.04 0.60 - 1.29 mg/dL Final         Passed - K in normal range and within 180 days    Potassium  Date Value Ref Range Status  05/01/2023 3.9 3.5 - 5.3 mmol/L Final         Passed - Na in normal range and within 180 days    Sodium  Date Value Ref Range Status  05/01/2023 139 135 - 146 mmol/L Final         Passed - Last BP in normal range    BP Readings from Last 1 Encounters:  05/01/23 138/80

## 2023-06-04 ENCOUNTER — Other Ambulatory Visit: Payer: Self-pay | Admitting: Family Medicine

## 2023-06-05 NOTE — Telephone Encounter (Signed)
Requested medications are due for refill today.  yes  Requested medications are on the active medications list.  yes  Last refill. 04/25/2023 #90 0 rf  Future visit scheduled.   no  Notes to clinic.  Refill not delegated.    Requested Prescriptions  Pending Prescriptions Disp Refills   traMADol (ULTRAM) 50 MG tablet [Pharmacy Med Name: tramadol 50 mg tablet] 90 tablet 0    Sig: TAKE ONE TABLET BY MOUTH EVERY 6 HOURS AS NEEDED FOR PAIN     Not Delegated - Analgesics:  Opioid Agonists Failed - 06/04/2023  8:34 AM      Failed - This refill cannot be delegated      Failed - Urine Drug Screen completed in last 360 days      Failed - Valid encounter within last 3 months    Recent Outpatient Visits           1 year ago Right flank pain   [facility] Donita Brooks, MD   2 years ago Special screening for malignant neoplasms, colon   [facility] Donita Brooks, MD   2 years ago Fatigue, unspecified type   [facility] Pickard, Priscille Heidelberg, MD   3 years ago Pure hypercholesterolemia   [facility] Pickard, Priscille Heidelberg, MD   3 years ago Myalgia   [facility] Pickard, Priscille Heidelberg, MD

## 2023-06-11 ENCOUNTER — Other Ambulatory Visit: Payer: Self-pay | Admitting: Family Medicine

## 2023-06-13 ENCOUNTER — Encounter: Payer: Self-pay | Admitting: Family Medicine

## 2023-06-16 MED ORDER — TRAMADOL HCL 50 MG PO TABS: 50.0000 mg | ORAL_TABLET | Freq: Four times a day (QID) | ORAL | 3 refills | Status: DC | PRN

## 2023-07-11 ENCOUNTER — Other Ambulatory Visit: Payer: Self-pay | Admitting: Family Medicine

## 2023-07-11 NOTE — Telephone Encounter (Signed)
Requested medication (s) are due for refill today: Yes  Requested medication (s) are on the active medication list: Yes  Last refill:  04/11/23 #30, 2RF  Future visit scheduled: No  Notes to clinic:  Manual review required. Last OV 05/01/23     Requested Prescriptions  Pending Prescriptions Disp Refills   meloxicam (MOBIC) 15 MG tablet [Pharmacy Med Name: meloxicam 15 mg tablet] 30 tablet 2    Sig: TAKE 1 TABLET BY MOUTH ONCE DAILY WITH A MEAL.     Analgesics:  COX2 Inhibitors Failed - 07/11/2023  1:44 PM      Failed - Manual Review: Labs are only required if the patient has taken medication for more than 8 weeks.      Failed - Valid encounter within last 12 months    Recent Outpatient Visits           1 year ago Right flank pain   [facility] Tanya Nones, Priscille Heidelberg, MD   2 years ago Special screening for malignant neoplasms, colon   [facility] Donita Brooks, MD   2 years ago Fatigue, unspecified type   [facility] Pickard, Priscille Heidelberg, MD   3 years ago Pure hypercholesterolemia   [facility] Pickard, Priscille Heidelberg, MD   3 years ago Myalgia   [facility] Pickard, Priscille Heidelberg, MD              Passed - HGB in normal range and within 360 days    Hemoglobin  Date Value Ref Range Status  05/01/2023 15.1 13.2 - 17.1 g/dL Final         Passed - Cr in normal range and within 360 days    Creat  Date Value Ref Range Status  05/01/2023 1.04 0.60 - 1.29 mg/dL Final         Passed - HCT in normal range and within 360 days    HCT  Date Value Ref Range Status  05/01/2023 45.9 38.5 - 50.0 % Final         Passed - AST in normal range and within 360 days    AST  Date Value Ref Range Status  05/01/2023 15 10 - 40 U/L Final         Passed - ALT in normal range and within 360 days    ALT  Date Value Ref Range Status  05/01/2023 18 9 - 46 U/L Final         Passed - eGFR is 30 or above and within  360 days    GFR, Est African American  Date Value Ref Range Status  02/29/2020 110 > OR = 60 mL/min/1.78m2 Final   GFR, Est Non African American  Date Value Ref Range Status  02/29/2020 95 > OR = 60 mL/min/1.8m2 Final   eGFR  Date Value Ref Range Status  05/01/2023 89 > OR = 60 mL/min/1.19m2 Final         Passed - Patient is not pregnant

## 2023-08-03 ENCOUNTER — Encounter: Payer: Self-pay | Admitting: Family Medicine

## 2023-08-06 ENCOUNTER — Other Ambulatory Visit: Payer: Self-pay

## 2023-08-06 DIAGNOSIS — R7309 Other abnormal glucose: Secondary | ICD-10-CM

## 2023-08-06 DIAGNOSIS — R7303 Prediabetes: Secondary | ICD-10-CM

## 2023-08-06 DIAGNOSIS — I10 Essential (primary) hypertension: Secondary | ICD-10-CM

## 2023-08-06 DIAGNOSIS — M797 Fibromyalgia: Secondary | ICD-10-CM

## 2023-08-06 DIAGNOSIS — Z13220 Encounter for screening for lipoid disorders: Secondary | ICD-10-CM

## 2023-08-08 ENCOUNTER — Ambulatory Visit: Payer: 59 | Admitting: Family Medicine

## 2023-08-08 VITALS — BP 142/80 | HR 80 | Ht 69.5 in | Wt 247.0 lb

## 2023-08-08 DIAGNOSIS — I480 Paroxysmal atrial fibrillation: Secondary | ICD-10-CM | POA: Diagnosis not present

## 2023-08-08 MED ORDER — METOPROLOL SUCCINATE ER 25 MG PO TB24
25.0000 mg | ORAL_TABLET | Freq: Every day | ORAL | 3 refills | Status: AC
Start: 2023-08-08 — End: ?

## 2023-08-08 NOTE — Progress Notes (Signed)
Subjective:    Patient ID: Stephen Allen, male    DOB: [birth year], 48 y.o.   MRN: [id number]  Patient states that he has been experiencing an irregular heartbeat recently.  He recently purchased an app for his Fitbit that can track his heart rhythm.  He showed me the tracing today.  He has had several episodes that the monitor has declared to be atrial fibrillation.  1 EKG tracing appears to show atrial fibrillation.  He has an irregular heart rhythm.  There are no discernible P waves seen.  Therefore he appears to be in atrial fibrillation.  He states that the last seconds to minutes and then spontaneously stops.  I calculated his CHA2DS2-VASc score to be 1.  Therefore his stroke risk is less than 0.6 %/year not warrant anticoagulation Past Medical History:  Diagnosis Date   Arthritis    Phreesia 04/03/2020   Asthma    Dx'd age 14   Asthma    Phreesia 04/03/2020   Migraines    Obesity    Past Surgical History:  Procedure Laterality Date   HEMANGIOMA EXCISION  04/21/2006   SHOULDER SURGERY Left 07/22/2004   TOE SURGERY Left 06/2021   [facility] (2nd toe)   Current Outpatient Medications on File Prior to Visit  Medication Sig Dispense Refill   ALPRAZolam (XANAX) 0.5 MG tablet TAKE 1 TABLET BY MOUTH 3 TIMES A DAY AS NEEDED FOR ANXIETY. 30 tablet 0   DULoxetine (CYMBALTA) 30 MG capsule Take 1 capsule (30 mg total) by mouth 2 (two) times daily. 180 capsule 1   Glucos-Chondroit-Hyaluron-MSM (GLUCOSAMINE CHONDROITIN JOINT PO) Take by mouth.     hydrochlorothiazide (HYDRODIURIL) 25 MG tablet TAKE 1 TABLET BY MOUTH ONCE DAILY. 90 tablet 0   meloxicam (MOBIC) 15 MG tablet TAKE 1 TABLET BY MOUTH ONCE DAILY WITH A MEAL. 30 tablet 2   metoprolol succinate (TOPROL-XL) 50 MG 24 hr tablet TAKE (1) TABLET BY MOUTH ONCE DAILY. 90 tablet 0   pregabalin (LYRICA) 50 MG capsule Take 1 capsule (50 mg total) by mouth 2 (two) times daily. 60 capsule 5   rosuvastatin (CRESTOR) 10 MG tablet Take 1  tablet (10 mg total) by mouth daily. 90 tablet 3   sildenafil (VIAGRA) 100 MG tablet Take 0.5-1 tablets (50-100 mg total) by mouth daily as needed for erectile dysfunction. 5 tablet 0   traMADol (ULTRAM) 50 MG tablet Take 1 tablet (50 mg total) by mouth every 6 (six) hours as needed. Twice a day 60 tablet 3   No current facility-administered medications on file prior to visit.   Allergies  Allergen Reactions   Cortisone Other (See Comments)    Hx of retinal detachment   Albuterol     Migraine    Social History   Socioeconomic History   Marital status: Married    Spouse name: Not on file   Number of children: Not on file   Years of education: Not on file   Highest education level: Not on file  Occupational History   Not on file  Tobacco Use   Smoking status: Never   Smokeless tobacco: Never  Vaping Use   Vaping status: Never Used  Substance and Sexual Activity   Alcohol use: Yes    Comment: Rarely   Drug use: No   Sexual activity: Not on file  Other Topics Concern   Not on file  Social History Narrative   Not on file   Social Drivers of Health  Financial Resource Strain: Not on file  Food Insecurity: Not on file  Transportation Needs: Not on file  Physical Activity: Not on file  Stress: Not on file  Social Connections: Not on file  Intimate Partner Violence: Not on file     Review of Systems  All other systems reviewed and are negative.      Objective:   Physical Exam Vitals reviewed.  Constitutional:      General: He is not in acute distress.    Appearance: Normal appearance. He is well-developed. He is obese. He is not diaphoretic.  Cardiovascular:     Rate and Rhythm: Normal rate and regular rhythm.     Heart sounds: Normal heart sounds. No murmur heard.    No friction rub. No gallop.  Pulmonary:     Effort: Pulmonary effort is normal.     Breath sounds: Normal breath sounds.  Lymphadenopathy:     Cervical: No cervical adenopathy.   Neurological:     Mental Status: He is alert.           Assessment & Plan:  Paroxysmal atrial fibrillation [facility]) Patient appears to be having paroxysmal atrial fibrillation.  Recent lab work shows hyperlipidemia but no other obvious explanation.  I will add a TSH.  Recommended cardiology consultation to evaluate for structural problems in the heart that could precipitate this.  He also reports some shortness of breath with activity and therefore I feel that the patient would likely benefit from an ischemic workup as well.  Meanwhile start Toprol-XL 25 mg daily.  Eliquis is not indicated based on his low CHA2DS2-VASc score.

## 2023-08-10 LAB — CBC WITH DIFFERENTIAL/PLATELET
Absolute Lymphocytes: 1886 {cells}/uL (ref 850–3900)
Absolute Monocytes: 389 {cells}/uL (ref 200–950)
Basophils Absolute: 38 {cells}/uL (ref 0–200)
Basophils Relative: 0.8 %
Eosinophils Absolute: 168 {cells}/uL (ref 15–500)
Eosinophils Relative: 3.5 %
HCT: 43.5 % (ref 38.5–50.0)
Hemoglobin: 14.7 g/dL (ref 13.2–17.1)
MCH: 29.5 pg (ref 27.0–33.0)
MCHC: 33.8 g/dL (ref 32.0–36.0)
MCV: 87.2 fL (ref 80.0–100.0)
MPV: 10.1 fL (ref 7.5–12.5)
Monocytes Relative: 8.1 %
Neutro Abs: 2318 {cells}/uL (ref 1500–7800)
Neutrophils Relative %: 48.3 %
Platelets: 297 10*3/uL (ref 140–400)
RBC: 4.99 10*6/uL (ref 4.20–5.80)
RDW: 13.1 % (ref 11.0–15.0)
Total Lymphocyte: 39.3 %
WBC: 4.8 10*3/uL (ref 3.8–10.8)

## 2023-08-10 LAB — COMPLETE METABOLIC PANEL WITH GFR
AG Ratio: 1.8 (calc) (ref 1.0–2.5)
AST: 13 U/L (ref 10–40)
Albumin: 4.4 g/dL (ref 3.6–5.1)
Alkaline phosphatase (APISO): 85 U/L (ref 36–130)
Glucose, Bld: 103 mg/dL — ABNORMAL HIGH (ref 65–99)
Potassium: 4 mmol/L (ref 3.5–5.3)
Total Protein: 6.9 g/dL (ref 6.1–8.1)
eGFR: 96 mL/min/{1.73_m2} (ref >=60)

## 2023-08-10 LAB — COMPLETE METABOLIC PANEL WITHOUT GFR
ALT: 17 U/L (ref 9–46)
BUN: 13 mg/dL (ref 7–25)
CO2: 30 mmol/L (ref 20–32)
Calcium: 9.5 mg/dL (ref 8.6–10.3)
Chloride: 101 mmol/L (ref 98–110)
Creat: 0.98 mg/dL (ref 0.60–1.29)
Globulin: 2.5 g/dL (ref 1.9–3.7)
Sodium: 140 mmol/L (ref 135–146)
Total Bilirubin: 0.5 mg/dL (ref 0.2–1.2)

## 2023-08-10 LAB — LIPID PANEL
Cholesterol: 205 mg/dL — ABNORMAL HIGH (ref <200)
HDL: 43 mg/dL (ref >=40)
LDL Cholesterol (Calc): 125 mg/dL — ABNORMAL HIGH
Non-HDL Cholesterol (Calc): 162 mg/dL — ABNORMAL HIGH (ref <130)
Total CHOL/HDL Ratio: 4.8 (calc) (ref <5.0)
Triglycerides: 232 mg/dL — ABNORMAL HIGH (ref <150)

## 2023-08-10 LAB — TEST AUTHORIZATION

## 2023-08-10 LAB — HEMOGLOBIN A1C
Hgb A1c MFr Bld: 5.9 %{Hb} — ABNORMAL HIGH (ref <5.7)
Mean Plasma Glucose: 123 mg/dL
eAG (mmol/L): 6.8 mmol/L

## 2023-08-10 LAB — TSH: TSH: 2.28 m[IU]/L (ref 0.40–4.50)

## 2023-08-13 ENCOUNTER — Telehealth: Payer: Self-pay

## 2023-08-13 NOTE — Telephone Encounter (Signed)
Copied from CRM [id number]. Topic: Referral - Status >> Aug 13, 2023  4:27 PM Mosetta Putt H wrote: Reason for CRM: Patient is wanting to check status of referral to cardiologist

## 2023-08-21 ENCOUNTER — Other Ambulatory Visit: Payer: Self-pay

## 2023-08-21 ENCOUNTER — Other Ambulatory Visit: Payer: Self-pay | Admitting: Family Medicine

## 2023-08-21 ENCOUNTER — Telehealth: Payer: Self-pay | Admitting: Family Medicine

## 2023-08-21 DIAGNOSIS — M25522 Pain in left elbow: Secondary | ICD-10-CM | POA: Insufficient documentation

## 2023-08-21 DIAGNOSIS — M797 Fibromyalgia: Secondary | ICD-10-CM

## 2023-08-21 DIAGNOSIS — M170 Bilateral primary osteoarthritis of knee: Secondary | ICD-10-CM

## 2023-08-21 DIAGNOSIS — M357 Hypermobility syndrome: Secondary | ICD-10-CM

## 2023-08-21 DIAGNOSIS — M7918 Myalgia, other site: Secondary | ICD-10-CM

## 2023-08-21 MED ORDER — MELOXICAM 15 MG PO TABS: 15.0000 mg | ORAL_TABLET | Freq: Every day | ORAL | 2 refills | Status: DC

## 2023-08-21 NOTE — Telephone Encounter (Signed)
Prescription Request  08/21/2023  LOV: 08/08/2023  What is the name of the medication or equipment?   meloxicam (MOBIC) 15 MG tablet   Have you contacted your pharmacy to request a refill? Yes   Which pharmacy would you like this sent to?  [facility] [place], [place] - [id number] [place] [address] Phone: [phone] Fax: [phone]    Patient notified that their request is being sent to the clinical staff for review and that they should receive a response within 2 business days.   Please advise pharmacist.

## 2023-08-22 ENCOUNTER — Ambulatory Visit: Payer: 59

## 2023-08-22 ENCOUNTER — Other Ambulatory Visit: Payer: Self-pay | Admitting: Family Medicine

## 2023-08-22 ENCOUNTER — Encounter: Payer: Self-pay | Admitting: Internal Medicine

## 2023-08-22 ENCOUNTER — Ambulatory Visit: Payer: 59 | Attending: Internal Medicine | Admitting: Internal Medicine

## 2023-08-22 VITALS — BP 141/85 | HR 68 | Ht 69.0 in | Wt 248.8 lb

## 2023-08-22 DIAGNOSIS — I10 Essential (primary) hypertension: Secondary | ICD-10-CM

## 2023-08-22 DIAGNOSIS — I480 Paroxysmal atrial fibrillation: Secondary | ICD-10-CM | POA: Diagnosis not present

## 2023-08-22 DIAGNOSIS — I4891 Unspecified atrial fibrillation: Secondary | ICD-10-CM | POA: Diagnosis not present

## 2023-08-22 DIAGNOSIS — R030 Elevated blood-pressure reading, without diagnosis of hypertension: Secondary | ICD-10-CM

## 2023-08-22 DIAGNOSIS — M357 Hypermobility syndrome: Secondary | ICD-10-CM | POA: Diagnosis not present

## 2023-08-22 MED ORDER — RIVAROXABAN 20 MG PO TABS: 20.0000 mg | ORAL_TABLET | Freq: Every day | ORAL | 11 refills | Status: DC

## 2023-08-22 MED ORDER — HYDROCHLOROTHIAZIDE 25 MG PO TABS: ORAL_TABLET | ORAL | 0 refills | Status: DC

## 2023-08-22 NOTE — Progress Notes (Signed)
Cardiology Office Note  Date: 08/22/2023   ID: Stephen Allen, DOB Oct 26, 1975, MRN [id number]  PCP:  Stephen Brooks, MD  Cardiologist:  Stephen Bicker, MD Electrophysiologist:  None   History of Present Illness: Stephen Allen is a 48 y.o. male known to have HTN was referred to cardiology clinic for evaluation of atrial fibrillation.  Patient had ongoing symptoms of SOB and severe fatigue the last 1 month.  His smart watch showed A-fib.  He was started metoprolol to his PCP after which his symptoms completely resolved.  He also had symptoms of chest pain when he was in A-fib.  Otherwise no chest pain with exertion.  No syncope, no leg swelling.  Past Medical History:  Diagnosis Date   Arthritis    Phreesia 04/03/2020   Asthma    Dx'd age 43   Asthma    Phreesia 04/03/2020   Migraines    Obesity     Past Surgical History:  Procedure Laterality Date   HEMANGIOMA EXCISION  04/21/2006   SHOULDER SURGERY Left 07/22/2004   TOE SURGERY Left 06/2021   [facility] (2nd toe)    Current Outpatient Medications  Medication Sig Dispense Refill   ALPRAZolam (XANAX) 0.5 MG tablet TAKE 1 TABLET BY MOUTH 3 TIMES A DAY AS NEEDED FOR ANXIETY. 30 tablet 0   DULoxetine (CYMBALTA) 30 MG capsule Take 1 capsule (30 mg total) by mouth 2 (two) times daily. (Patient taking differently: Take 30 mg by mouth daily.) 180 capsule 1   Glucos-Chondroit-Hyaluron-MSM (GLUCOSAMINE CHONDROITIN JOINT PO) Take by mouth.     hydrochlorothiazide (HYDRODIURIL) 25 MG tablet TAKE 1 TABLET BY MOUTH ONCE DAILY. 90 tablet 0   meloxicam (MOBIC) 15 MG tablet Take 1 tablet (15 mg total) by mouth daily. 30 tablet 2   metoprolol succinate (TOPROL-XL) 25 MG 24 hr tablet Take 1 tablet (25 mg total) by mouth daily. 90 tablet 3   pregabalin (LYRICA) 50 MG capsule Take 1 capsule (50 mg total) by mouth 2 (two) times daily. 60 capsule 5   rivaroxaban (XARELTO) 20 MG TABS tablet Take 1 tablet (20 mg total) by mouth  daily with supper. 30 tablet 11   sildenafil (VIAGRA) 100 MG tablet Take 0.5-1 tablets (50-100 mg total) by mouth daily as needed for erectile dysfunction. 5 tablet 2   traMADol (ULTRAM) 50 MG tablet Take 1 tablet (50 mg total) by mouth every 6 (six) hours as needed. Twice a day 60 tablet 3   metoprolol succinate (TOPROL-XL) 50 MG 24 hr tablet TAKE (1) TABLET BY MOUTH ONCE DAILY. (Patient not taking: Reported on 08/22/2023) 90 tablet 0   rosuvastatin (CRESTOR) 10 MG tablet Take 1 tablet (10 mg total) by mouth daily. (Patient not taking: Reported on 08/22/2023) 90 tablet 3   No current facility-administered medications for this visit.   Allergies:  Cortisone and Albuterol   Social History: The patient  reports that he has never smoked. He has never used smokeless tobacco. He reports current alcohol use. He reports that he does not use drugs.   Family History: The patient's family history includes Asthma in his brother; Cancer in his paternal grandmother; Diabetes in his brother; Hypertension in his father.   ROS:  Please see the history of present illness. Otherwise, complete review of systems is positive for none.  All other systems are reviewed and negative.   Physical Exam: VS:  BP (!) 141/85   Pulse 68   Ht 5\' 9"  (  1.753 m)   Wt 248 lb 12.8 oz (112.9 kg)   SpO2 96%   BMI 36.74 kg/m , BMI Body mass index is 36.74 kg/m.  Wt Readings from Last 3 Encounters:  08/22/23 248 lb 12.8 oz (112.9 kg)  08/08/23 247 lb (112 kg)  05/01/23 244 lb (110.7 kg)    General: Patient appears comfortable at rest. HEENT: Conjunctiva and lids normal, oropharynx clear with moist mucosa. Neck: Supple, no elevated JVP or carotid bruits, no thyromegaly. Lungs: Clear to auscultation, nonlabored breathing at rest. Cardiac: Regular rate and rhythm, no S3 or significant systolic murmur, no pericardial rub. Abdomen: Soft, nontender, no hepatomegaly, bowel sounds present, no guarding or rebound. Extremities: No  pitting edema, distal pulses 2+. Skin: Warm and dry. Musculoskeletal: No kyphosis. Neuropsychiatric: Alert and oriented x3, affect grossly appropriate.  Recent Labwork: 08/06/2023: ALT 17; AST 13; BUN 13; Creat 0.98; Hemoglobin 14.7; Platelets 297; Potassium 4.0; Sodium 140; TSH 2.28     Component Value Date/Time   CHOL 205 (H) 08/06/2023 0813   TRIG 232 (H) 08/06/2023 0813   HDL 43 08/06/2023 0813   CHOLHDL 4.8 08/06/2023 0813   VLDL 66 (H) 01/15/2017 1024   LDLCALC 125 (H) 08/06/2023 0813     Assessment and Plan:  Paroxysmal A-fib HTN, controlled  -Ongoing symptoms of SOB and fatigue for the last 1 month.  His smart watch alerted him with A-fib.  I reviewed the 32nd strips on his phone that were consistent with atrial fibrillation.  After his PCP started him on metoprolol, his symptoms of SOB and fatigue, later resolved.  EKG today showed NSR.  Will obtain 2-week event monitor, instructed patient to come off metoprolol while he is wearing heart monitor and resume after the monitor is removed from his chest.  CHA2DS2-VASc score is 1, after shared decision making with the patient, will start Xarelto 20 mg daily with supper. I reviewed the risk and benefits of being on [facility].  Based on the A-fib burden on the event monitor, he might benefit from PVI.  Obtain echocardiogram.  Obtain home sleep study for OSA evaluation.  TSH within normal limits, check this month.    Medication Adjustments/Labs and Tests Ordered: Current medicines are reviewed at length with the patient today.  Concerns regarding medicines are outlined above.    Disposition:  Follow up 6 months  Signed, Stephen Derocher Verne Spurr, MD, 08/22/2023 6:16 PM    [facility] at [facility] [address] [place], [place] [address]

## 2023-08-22 NOTE — Progress Notes (Signed)
Sleep Apnea Evaluation  [facility]  Today's Date: 08/22/2023   Patient Name: Stephen Allen        DOB: Mar 30, 1976       Height:  5\' 9"  (1.753 m)     Weight: 248 lb 12.8 oz (112.9 kg)  BMI: Body mass index is 36.74 kg/m.    Referring Provider:  Luane School, MD   STOP-BANG RISK ASSESSMENT         If STOP-BANG Score >=3 OR two clinical symptoms - patient qualifies for WatchPAT (CPT 95800)      Sleep study ordered due to two (2) of the following clinical symptoms/diagnoses:  Excessive daytime sleepiness G47.10  Gastroesophageal reflux K21.9  Nocturia R35.1  Morning Headaches G44.221  Difficulty concentrating R41.840  Memory problems or poor judgment G31.84  Personality changes or irritability R45.4  Loud snoring R06.83  Depression F32.9  Unrefreshed by sleep G47.8  Impotence N52.9  History of high blood pressure R03.0  Insomnia G47.00  Sleep Disordered Breathing or Sleep Apnea ICD G47.33

## 2023-08-22 NOTE — Patient Instructions (Signed)
Medication Instructions:  Your physician has recommended you make the following change in your medication:   -Start Xarelto 20 mg once daily with supper.   *If you need a refill on your cardiac medications before your next appointment, please call your pharmacy*   Lab Work: None If you have labs (blood work) drawn today and your tests are completely normal, you will receive your results only by: MyChart Message (if you have MyChart) OR A paper copy in the mail If you have any lab test that is abnormal or we need to change your treatment, we will call you to review the results.   Testing/Procedures: Your physician has recommended that you have a sleep study. This test records several body functions during sleep, including: brain activity, eye movement, oxygen and carbon dioxide blood levels, heart rate and rhythm, breathing rate and rhythm, the flow of air through your mouth and nose, snoring, body muscle movements, and chest and belly movement.     Follow-Up: At [facility], you and your health needs are our priority.  As part of our continuing mission to provide you with exceptional heart care, we have created designated Provider Care Teams.  These Care Teams include your primary Cardiologist (physician) and Advanced Practice Providers (APPs -  Physician Assistants and Nurse Practitioners) who all work together to provide you with the care you need, when you need it.  We recommend signing up for the patient portal called "MyChart".  Sign up information is provided on this After Visit Summary.  MyChart is used to connect with patients for Virtual Visits (Telemedicine).  Patients are able to view lab/test results, encounter notes, upcoming appointments, etc.  Non-urgent messages can be sent to your provider as well.   To learn more about what you can do with MyChart, go to [email or web address].    Your next appointment:   6 month(s)  Provider:   You may see Vishnu P  Mallipeddi, MD or one of the following Advanced Practice Providers on your designated Care Team:   [place], PA-C  Jacolyn Reedy, PA-C     Other Instructions ZIO XT- Long Term Monitor Instructions   Your physician has requested you wear your ZIO patch monitor___14____days.   This is a single patch monitor.  [employer] supplies one patch monitor per enrollment.  Additional stickers are not available.   Please do not apply patch if you will be having a Nuclear Stress Test, Echocardiogram, Cardiac CT, MRI, or Chest Xray during the time frame you would be wearing the monitor. The patch cannot be worn during these tests.  You cannot remove and re-apply the ZIO XT patch monitor.   Your ZIO patch monitor will be sent USPS Priority mail from [facility] directly to your home address. The monitor may also be mailed to a PO BOX if home delivery is not available.   It may take 3-5 days to receive your monitor after you have been enrolled.   Once you have received you monitor, please review enclosed instructions.  Your monitor has already been registered assigning a specific monitor serial # to you.   Applying the monitor   Shave hair from upper left chest.   Hold abrader disc by orange tab.  Rub abrader in 40 strokes over left upper chest as indicated in your monitor instructions.   Clean area with 4 enclosed alcohol pads .  Use all pads to assure are is cleaned thoroughly.  Let dry.   Apply patch as indicated  in monitor instructions.  Patch will be place under collarbone on left side of chest with arrow pointing upward.   Rub patch adhesive wings for 2 minutes.Remove white label marked "1".  Remove white label marked "2".  Rub patch adhesive wings for 2 additional minutes.   While looking in a mirror, press and release button in center of patch.  A small green light will flash 3-4 times .  This will be your only indicator the monitor has been turned on.     Do not shower for the  first 24 hours.  You may shower after the first 24 hours.   Press button if you feel a symptom. You will hear a small click.  Record Date, Time and Symptom in the Patient Log Book.   When you are ready to remove patch, follow instructions on last 2 pages of Patient Log Book.  Stick patch monitor onto last page of Patient Log Book.   Place Patient Log Book in [place] box.  Use locking tab on box and tape box closed securely.  The [employer] has [employer] on it.  Please place in mailbox as soon as possible.  Your physician should have your test results approximately 7 days after the monitor has been mailed back to [facility].   Call [facility] Customer Care at [phone] if you have questions regarding your ZIO XT patch monitor.  Call them immediately if you see an orange light blinking on your monitor.   If your monitor falls off in less than 4 days contact our Monitor department at [phone].  If your monitor becomes loose or falls off after 4 days call [employer] at [phone] for suggestions on securing your monitor.

## 2023-08-31 DIAGNOSIS — I4891 Unspecified atrial fibrillation: Secondary | ICD-10-CM

## 2023-09-04 DIAGNOSIS — M7712 Lateral epicondylitis, left elbow: Secondary | ICD-10-CM | POA: Insufficient documentation

## 2023-09-09 ENCOUNTER — Ambulatory Visit (HOSPITAL_COMMUNITY)
Admission: RE | Admit: 2023-09-09 | Discharge: 2023-09-09 | Disposition: A | Discharge status: Home / Self Care | Payer: 59 | Source: Ambulatory Visit | Attending: Internal Medicine | Admitting: Internal Medicine

## 2023-09-09 DIAGNOSIS — I4891 Unspecified atrial fibrillation: Secondary | ICD-10-CM | POA: Insufficient documentation

## 2023-09-09 LAB — ECHOCARDIOGRAM COMPLETE
AR max vel: 2.93 cm2
AV Area VTI: 2.65 cm2
AV Area mean vel: 2.54 cm2
AV Mean grad: 4 mm[Hg]
AV Peak grad: 5.4 mm[Hg]
Ao pk vel: 1.16 m/s
Area-P 1/2: 3.16 cm2
Calc EF: 60.7 %
MV VTI: 2.49 cm2
S' Lateral: 3.6 cm
Single Plane A2C EF: 65.3 %
Single Plane A4C EF: 57.1 %

## 2023-10-19 ENCOUNTER — Other Ambulatory Visit: Payer: Self-pay | Admitting: Family Medicine

## 2023-10-21 NOTE — Telephone Encounter (Signed)
 Requested medications are due for refill today.  yes  Requested medications are on the active medications list.  yes  Last refill. 06/16/2023 #60 3 rf  Future visit scheduled.   no  Notes to clinic.  Refill not delegated.    Requested Prescriptions  Pending Prescriptions Disp Refills   traMADol (ULTRAM) 50 MG tablet [Pharmacy Med Name: tramadol 50 mg tablet] 60 tablet 3    Sig: TAKE ONE TABLET BY MOUTH TWICE DAILY.     Not Delegated - Analgesics:  Opioid Agonists Failed - 10/21/2023  1:35 PM      Failed - This refill cannot be delegated      Failed - Urine Drug Screen completed in last 360 days      Failed - Valid encounter within last 3 months    Recent Outpatient Visits           2 months ago Paroxysmal atrial fibrillation [facility])   [facility] Donita Brooks, MD   5 months ago Benign essential HTN   [facility] Tanya Nones, Priscille Heidelberg, MD   1 year ago Elevated glucose   [employer] [facility] Donita Brooks, MD   1 year ago Excessive granulation tissue   [facility] Tanya Nones, Priscille Heidelberg, MD   1 year ago Paronychia of finger of right hand   [facility] Pickard, Priscille Heidelberg, MD

## 2023-10-27 LAB — COLOGUARD: COLOGUARD: NEGATIVE

## 2023-11-03 ENCOUNTER — Encounter: Payer: Self-pay | Admitting: Family Medicine

## 2023-11-10 ENCOUNTER — Telehealth: Payer: Self-pay

## 2023-11-10 NOTE — Telephone Encounter (Signed)
**Note De-Identified Stephen Allen Obfuscation** Ordering provider: Dr Mallipeddi Associated diagnoses: Fatigue-R53.83 and A-Fib-I48.0  WatchPAT PA obtained on 11/10/2023 by [name], [name], LPN. Authorization: Per [employer] website, [employer] does not require a prior authorization for CPT Code: 958001 (Itamar-HST).  Patient notified of PIN (1234) on 11/10/2023 [name] Notification Method: phone-No answer so I left a message on the pts VM (Ok per [employer]) advising him of the [facility] One-HST Device Pin # of "[id number]". I also sent the pt a [facility] message advising him of the Pin # as well.  Phone note routed to covering staff for follow-up.

## 2023-11-13 DIAGNOSIS — G5602 Carpal tunnel syndrome, left upper limb: Secondary | ICD-10-CM | POA: Insufficient documentation

## 2023-11-15 ENCOUNTER — Other Ambulatory Visit: Payer: Self-pay | Admitting: Family Medicine

## 2023-11-15 DIAGNOSIS — M797 Fibromyalgia: Secondary | ICD-10-CM

## 2023-11-16 ENCOUNTER — Other Ambulatory Visit: Payer: Self-pay | Admitting: Family Medicine

## 2023-11-16 DIAGNOSIS — M170 Bilateral primary osteoarthritis of knee: Secondary | ICD-10-CM

## 2023-11-16 DIAGNOSIS — M797 Fibromyalgia: Secondary | ICD-10-CM

## 2023-11-16 DIAGNOSIS — M357 Hypermobility syndrome: Secondary | ICD-10-CM

## 2023-11-16 DIAGNOSIS — M7918 Myalgia, other site: Secondary | ICD-10-CM

## 2023-11-17 NOTE — Telephone Encounter (Signed)
 OV 08/08/23 Requested Prescriptions  Pending Prescriptions Disp Refills   DULoxetine  (CYMBALTA ) 30 MG capsule [Pharmacy Med Name: duloxetine  30 mg capsule,delayed release] 180 capsule 1    Sig: Take 1 capsule (30 mg total) by mouth 2 (two) times daily.     Psychiatry: Antidepressants - SNRI - duloxetine  Failed - 11/17/2023 11:34 AM      Failed - Completed PHQ-2 or PHQ-9 in the last 360 days      Failed - Last BP in normal range    BP Readings from Last 1 Encounters:  08/22/23 (!) 141/85         Failed - Valid encounter within last 6 months    Recent Outpatient Visits           3 months ago Paroxysmal atrial fibrillation [facility])   [place] [facility] Austine Lefort, MD   6 months ago Benign essential HTN   [place] [facility] Pickard, Cisco Crest, MD   1 year ago Elevated glucose   [place] [facility] Pickard, Cisco Crest, MD   1 year ago Excessive granulation tissue   [place] [facility] Cheril Cork, Cisco Crest, MD   1 year ago Paronychia of finger of right hand   [place] [facility] Pickard, Cisco Crest, MD              Passed - Cr in normal range and within 360 days    Creat  Date Value Ref Range Status  08/06/2023 0.98 0.60 - 1.29 mg/dL Final         Passed - eGFR is 30 or above and within 360 days    GFR, Est African American  Date Value Ref Range Status  02/29/2020 110 > OR = 60 mL/min/1.39m2 Final   GFR, Est Non African American  Date Value Ref Range Status  02/29/2020 95 > OR = 60 mL/min/1.55m2 Final   eGFR  Date Value Ref Range Status  08/06/2023 96 > OR = 60 mL/min/1.26m2 Final

## 2023-11-24 ENCOUNTER — Other Ambulatory Visit: Payer: Self-pay | Admitting: Family Medicine

## 2023-11-24 DIAGNOSIS — I10 Essential (primary) hypertension: Secondary | ICD-10-CM

## 2023-11-24 DIAGNOSIS — R030 Elevated blood-pressure reading, without diagnosis of hypertension: Secondary | ICD-10-CM

## 2023-11-26 NOTE — Telephone Encounter (Signed)
 Followed up with patient regarding Stephen Allen device. Patient stated that he was unaware that [employer] had approved his prior authorization for device and was unaware of Pin #. Advised pt that a VM and MyChart message was sent with information, but provided information again for pt over the phone. Patient stated that he would wear device.

## 2023-12-30 ENCOUNTER — Telehealth: Payer: Self-pay

## 2023-12-30 ENCOUNTER — Other Ambulatory Visit: Payer: Self-pay | Admitting: Family Medicine

## 2023-12-30 MED ORDER — PREGABALIN 50 MG PO CAPS: 50.0000 mg | ORAL_CAPSULE | Freq: Two times a day (BID) | ORAL | 5 refills | Status: DC

## 2023-12-30 NOTE — Telephone Encounter (Signed)
 Prescription Request  12/30/2023  LOV: 08/08/23  What is the name of the medication or equipment? pregabalin  (LYRICA ) 50 MG capsule [[id number]]   Have you contacted your pharmacy to request a refill? Yes   Which pharmacy would you like this sent to?  [facility] [place], [place] - [id number] [place] [address] Phone: [phone] Fax: [phone]    Patient notified that their request is being sent to the clinical staff for review and that they should receive a response within 2 business days.   Please advise at [facility] [phone]

## 2024-01-06 ENCOUNTER — Ambulatory Visit: Attending: Internal Medicine

## 2024-01-06 ENCOUNTER — Encounter (INDEPENDENT_AMBULATORY_CARE_PROVIDER_SITE_OTHER): Payer: Self-pay | Admitting: Cardiology

## 2024-01-06 DIAGNOSIS — G4733 Obstructive sleep apnea (adult) (pediatric): Secondary | ICD-10-CM | POA: Diagnosis not present

## 2024-01-06 DIAGNOSIS — I4891 Unspecified atrial fibrillation: Secondary | ICD-10-CM

## 2024-01-07 ENCOUNTER — Telehealth: Payer: Self-pay

## 2024-01-07 DIAGNOSIS — G4733 Obstructive sleep apnea (adult) (pediatric): Secondary | ICD-10-CM

## 2024-01-07 DIAGNOSIS — M357 Hypermobility syndrome: Secondary | ICD-10-CM

## 2024-01-07 DIAGNOSIS — I4891 Unspecified atrial fibrillation: Secondary | ICD-10-CM

## 2024-01-07 DIAGNOSIS — I480 Paroxysmal atrial fibrillation: Secondary | ICD-10-CM

## 2024-01-07 DIAGNOSIS — I10 Essential (primary) hypertension: Secondary | ICD-10-CM

## 2024-01-07 NOTE — Telephone Encounter (Signed)
-----   Message from Gaylyn Keas sent at 01/07/2024  9:48 AM EDT ----- Please let patient know that they have sleep apnea.  Recommend therapeutic CPAP titration for treatment of patient's sleep disordered breathing.

## 2024-01-07 NOTE — Telephone Encounter (Signed)
 Notified patient of sleep study results and recommendation. All questions were answered and patient verbalized understanding. CPAP Titration ordered today.

## 2024-01-07 NOTE — Procedures (Signed)
   SLEEP STUDY REPORT Patient Information Study Date: 01/06/2024 Patient Name: Stephen Allen Patient ID: [id number] Birth Date: 03/12/1976 Age: 48 Gender: Male BMI: 36.9 (W=249 lb, H=5' 9'') Referring Physician: Vishnu Mallipeddi, MD  TEST DESCRIPTION: Home sleep apnea testing was completed using the WatchPat, a Type 1 device, utilizing  peripheral arterial tonometry (PAT), chest movement, actigraphy, pulse oximetry, pulse rate, body position and snore.  AHI was calculated with apnea and hypopnea using valid sleep time as the denominator. RDI includes apneas,  hypopneas, and RERAs. The data acquired and the scoring of sleep and all associated events were performed in  accordance with the recommended standards and specifications as outlined in the AASM Manual for the Scoring of  Sleep and Associated Events 2.2.0 (2015).  FINDINGS:  1. Moderate Obstructive Sleep Apnea with AHI 22.1/hr.   2. No Central Sleep Apnea with pAHIc 2.8/hr.  3. Oxygen desaturations as low as 84%.  4. Moderate to severe snoring was present. O2 sats were < 88% for 0.8 min.  5. Total sleep time was 6 hrs and 22 min.  6. 26.6 % of total sleep time was spent in REM sleep.   7. Normal sleep onset latency at 24 min  8. Shortened REM sleep onset latency at 71 min.   9. Total awakenings were 6.  10. Arrhythmia detection: None  DIAGNOSIS:  Moderate Obstructive Sleep Apnea (G47.33)  RECOMMENDATIONS: 1. Clinical correlation of these findings is necessary. The decision to treat obstructive sleep apnea (OSA) is usually  based on the presence of apnea symptoms or the presence of associated medical conditions such as Hypertension,  Congestive Heart Failure, Atrial Fibrillation or Obesity. The most common symptoms of OSA are snoring, gasping for  breath while sleeping, daytime sleepiness and fatigue.  2. Initiating apnea therapy is recommended given the presence of symptoms and/or associated conditions.  Recommend  proceeding with one of the following:  a. Auto-CPAP therapy with a pressure range of 5-20cm H2O.  b. An oral appliance (OA) that can be obtained from certain dentists with expertise in sleep medicine. These are  primarily of use in non-obese patients with mild and moderate disease.  c. An ENT consultation which may be useful to look for specific causes of obstruction and possible treatment  options.  d. If patient is intolerant to PAP therapy, consider referral to ENT for evaluation for hypoglossal nerve stimulator.  3. Close follow-up is necessary to ensure success with CPAP or oral appliance therapy for maximum benefit . 4. A follow-up oximetry study on CPAP is recommended to assess the adequacy of therapy and determine the need  for supplemental oxygen or the potential need for Bi-level therapy. An arterial blood gas to determine the adequacy of  baseline ventilation and oxygenation should also be considered. 5. Healthy sleep recommendations include: adequate nightly sleep (normal 7-9 hrs/night), avoidance of caffeine after  noon and alcohol near bedtime, and maintaining a sleep environment that is cool, dark and quiet. 6. Weight loss for overweight patients is recommended. Even modest amounts of weight loss can significantly  improve the severity of sleep apnea. 7. Snoring recommendations include: weight loss where appropriate, side sleeping, and avoidance of alcohol before  bed. 8. Operation of motor vehicle should not be performed when sleepy.  Signature: Gaylyn Keas, MD; [facility]; Diplomat, American Board of Sleep  Medicine Electronically Signed: 01/07/2024 9:46:44 AM

## 2024-01-08 ENCOUNTER — Ambulatory Visit: Payer: Self-pay | Admitting: Internal Medicine

## 2024-01-27 ENCOUNTER — Telehealth: Payer: Self-pay | Admitting: *Deleted

## 2024-01-27 DIAGNOSIS — G4733 Obstructive sleep apnea (adult) (pediatric): Secondary | ICD-10-CM

## 2024-01-27 DIAGNOSIS — I4891 Unspecified atrial fibrillation: Secondary | ICD-10-CM

## 2024-01-27 NOTE — Telephone Encounter (Signed)
 Patient has declined the in lab titration he would like to have the APAP machine    PER Dr Shlomo, Order ResMed CPAP on auto from 4 to 15cm H2O with heated humidity, mask of choice   Upon patient request DME selection is [facility] Patient understands he will be contacted by [facility] to set up his cpap. Patient understands to call if [facility] does not contact him with new setup in a timely manner. Patient understands they will be called once confirmation has been received from [facility] that they have received their new machine to schedule 10 week follow up appointment.   [facility] notified of new cpap order  Please add to airview Patient was grateful for the call and thanked me.

## 2024-02-09 ENCOUNTER — Encounter: Payer: Self-pay | Admitting: Family Medicine

## 2024-02-09 ENCOUNTER — Ambulatory Visit: Admitting: Family Medicine

## 2024-02-09 VITALS — BP 136/80 | HR 78 | Temp 98.4°F | Ht 69.0 in | Wt 246.0 lb

## 2024-02-09 DIAGNOSIS — R5383 Other fatigue: Secondary | ICD-10-CM

## 2024-02-09 DIAGNOSIS — D225 Melanocytic nevi of trunk: Secondary | ICD-10-CM | POA: Insufficient documentation

## 2024-02-09 DIAGNOSIS — D1801 Hemangioma of skin and subcutaneous tissue: Secondary | ICD-10-CM | POA: Insufficient documentation

## 2024-02-09 DIAGNOSIS — I10 Essential (primary) hypertension: Secondary | ICD-10-CM

## 2024-02-09 LAB — LIPID PANEL
Cholesterol: 209 mg/dL — ABNORMAL HIGH (ref <200)
HDL: 39 mg/dL — ABNORMAL LOW (ref >=40)
LDL Cholesterol (Calc): 125 mg/dL — ABNORMAL HIGH
Non-HDL Cholesterol (Calc): 170 mg/dL — ABNORMAL HIGH (ref <130)
Total CHOL/HDL Ratio: 5.4 (calc) — ABNORMAL HIGH (ref <5.0)
Triglycerides: 317 mg/dL — ABNORMAL HIGH (ref <150)

## 2024-02-09 LAB — CBC WITH DIFFERENTIAL/PLATELET
Absolute Lymphocytes: 1755 {cells}/uL (ref 850–3900)
Absolute Monocytes: 349 {cells}/uL (ref 200–950)
Basophils Absolute: 41 {cells}/uL (ref 0–200)
Basophils Relative: 1 %
Eosinophils Absolute: 127 {cells}/uL (ref 15–500)
Eosinophils Relative: 3.1 %
HCT: 42.1 % (ref 38.5–50.0)
Hemoglobin: 14.1 g/dL (ref 13.2–17.1)
MCH: 29.6 pg (ref 27.0–33.0)
MCHC: 33.5 g/dL (ref 32.0–36.0)
MCV: 88.3 fL (ref 80.0–100.0)
MPV: 9.7 fL (ref 7.5–12.5)
Monocytes Relative: 8.5 %
Neutro Abs: 1829 {cells}/uL (ref 1500–7800)
Neutrophils Relative %: 44.6 %
Platelets: 250 Thousand/uL (ref 140–400)
RBC: 4.77 Million/uL (ref 4.20–5.80)
RDW: 13.1 % (ref 11.0–15.0)
Total Lymphocyte: 42.8 %
WBC: 4.1 Thousand/uL (ref 3.8–10.8)

## 2024-02-09 LAB — COMPREHENSIVE METABOLIC PANEL WITH GFR
AG Ratio: 2 (calc) (ref 1.0–2.5)
ALT: 16 U/L (ref 9–46)
AST: 13 U/L (ref 10–40)
Albumin: 4.4 g/dL (ref 3.6–5.1)
Alkaline phosphatase (APISO): 67 U/L (ref 36–130)
BUN: 14 mg/dL (ref 7–25)
CO2: 27 mmol/L (ref 20–32)
Calcium: 9 mg/dL (ref 8.6–10.3)
Chloride: 105 mmol/L (ref 98–110)
Creat: 0.96 mg/dL (ref 0.60–1.29)
Globulin: 2.2 g/dL (ref 1.9–3.7)
Glucose, Bld: 95 mg/dL (ref 65–99)
Potassium: 4.3 mmol/L (ref 3.5–5.3)
Sodium: 139 mmol/L (ref 135–146)
Total Bilirubin: 0.6 mg/dL (ref 0.2–1.2)
Total Protein: 6.6 g/dL (ref 6.1–8.1)
eGFR: 98 mL/min/1.73m2 (ref >=60)

## 2024-02-09 LAB — TESTOSTERONE TOTAL,FREE,BIO, MALES
Albumin: 4.4 g/dL (ref 3.6–5.1)
Sex Hormone Binding: 23 nmol/L (ref 10–50)
Testosterone, Bioavailable: 120.3 ng/dL (ref 110.0–575.0)
Testosterone, Free: 59.8 pg/mL (ref 46.0–224.0)
Testosterone: 348 ng/dL (ref 250–827)

## 2024-02-09 LAB — VITAMIN B12: Vitamin B-12: 336 pg/mL (ref 200–1100)

## 2024-02-09 MED ORDER — LOSARTAN POTASSIUM 50 MG PO TABS: 50.0000 mg | ORAL_TABLET | Freq: Every day | ORAL | 3 refills | Status: DC

## 2024-02-09 NOTE — Progress Notes (Signed)
 Subjective:    Patient ID: Stephen Allen, male    DOB: 08/14/1975, 48 y.o.   MRN: [id number]  Patient has a history of paroxysmal atrial fibrillation.  Cardiology performed a sleep study that showed moderate sleep apnea.  He is about to be treated for sleep apnea.  The patient reports fatigue and lack of energy.  He also complains of pain all over his body.  Specifically both knees and both hips causing constant pain.  He quit taking Cymbalta  and Lyrica  because they were no longer helping his pain.  We discussed a referral to a pain clinic however he does not want to go to [facility] due to a billing issue he experienced.  He denies any chest pain.  He does report profound fatigue.  He also reports muscle fatigue and weakness Past Medical History:  Diagnosis Date   Arthritis    [employer] 04/03/2020   Asthma    Dx'd age 53   Asthma    [employer] 04/03/2020   Migraines    Obesity    Past Surgical History:  Procedure Laterality Date   HEMANGIOMA EXCISION  04/21/2006   SHOULDER SURGERY Left 07/22/2004   TOE SURGERY Left 06/2021   [facility] (2nd toe)   Current Outpatient Medications on File Prior to Visit  Medication Sig Dispense Refill   ALPRAZolam  (XANAX ) 0.5 MG tablet TAKE 1 TABLET BY MOUTH 3 TIMES A DAY AS NEEDED FOR ANXIETY. 30 tablet 0   Glucos-Chondroit-Hyaluron-MSM (GLUCOSAMINE CHONDROITIN JOINT PO) Take by mouth.     hydrochlorothiazide  (HYDRODIURIL ) 25 MG tablet TAKE 1 TABLET BY MOUTH ONCE DAILY. 90 tablet 0   meloxicam  (MOBIC ) 15 MG tablet Take 1 tablet (15 mg total) by mouth daily. 30 tablet 2   metoprolol  succinate (TOPROL -XL) 50 MG 24 hr tablet TAKE (1) TABLET BY MOUTH ONCE DAILY. (Patient taking differently: Take 50 mg by mouth daily. Pt taking as needed.) 90 tablet 0   sildenafil  (VIAGRA ) 100 MG tablet Take 0.5-1 tablets (50-100 mg total) by mouth daily as needed for erectile dysfunction. 5 tablet 2   traMADol  (ULTRAM ) 50 MG tablet TAKE ONE TABLET BY MOUTH TWICE  DAILY. 60 tablet 3   DULoxetine  (CYMBALTA ) 30 MG capsule Take 1 capsule (30 mg total) by mouth 2 (two) times daily. (Patient not taking: Reported on 02/09/2024) 180 capsule 0   metoprolol  succinate (TOPROL -XL) 25 MG 24 hr tablet Take 1 tablet (25 mg total) by mouth daily. (Patient not taking: Reported on 02/09/2024) 90 tablet 3   pregabalin  (LYRICA ) 50 MG capsule Take 1 capsule (50 mg total) by mouth 2 (two) times daily. (Patient not taking: Reported on 02/09/2024) 60 capsule 5   rivaroxaban  (XARELTO ) 20 MG TABS tablet Take 1 tablet (20 mg total) by mouth daily with supper. (Patient not taking: Reported on 02/09/2024) 30 tablet 11   rosuvastatin  (CRESTOR ) 10 MG tablet Take 1 tablet (10 mg total) by mouth daily. (Patient not taking: Reported on 02/09/2024) 90 tablet 3   No current facility-administered medications on file prior to visit.   Allergies  Allergen Reactions   Cortisone Other (See Comments)    Hx of retinal detachment   Albuterol      Migraine    Social History   Socioeconomic History   Marital status: Married    Spouse name: Not on file   Number of children: Not on file   Years of education: Not on file   Highest education level: Not on file  Occupational  History   Not on file  Tobacco Use   Smoking status: Never   Smokeless tobacco: Never  Vaping Use   Vaping status: Never Used  Substance and Sexual Activity   Alcohol use: Yes    Comment: Rarely   Drug use: No   Sexual activity: Yes    Partners: Female    Comment: married  Other Topics Concern   Not on file  Social History Narrative   Not on file   Social Drivers of Health   Financial Resource Strain: Not on file  Food Insecurity: Not on file  Transportation Needs: Not on file  Physical Activity: Not on file  Stress: Not on file  Social Connections: Not on file  Intimate Partner Violence: Not on file     Review of Systems  All other systems reviewed and are negative.      Objective:   Physical  Exam Vitals reviewed.  Constitutional:      General: He is not in acute distress.    Appearance: Normal appearance. He is well-developed. He is obese. He is not diaphoretic.  Cardiovascular:     Rate and Rhythm: Normal rate and regular rhythm.     Heart sounds: Normal heart sounds. No murmur heard.    No friction rub. No gallop.  Pulmonary:     Effort: Pulmonary effort is normal.     Breath sounds: Normal breath sounds.  Lymphadenopathy:     Cervical: No cervical adenopathy.  Neurological:     Mental Status: He is alert.           Assessment & Plan:  Fatigue, unspecified type - Plan: Vitamin B12, Testosterone  Total,Free,Bio, Males  Benign essential HTN - Plan: Comprehensive metabolic panel with GFR, Lipid panel, CBC with Differential/Platelet Some of his fatigue could be related to obstructive sleep apnea.  I checked his thyroid  earlier this year and it was normal.  I have not checked his testosterone  level or his B12 level.  I will get that today to see if hypergonadism or vitamin B12 deficiency could be contributing to his fatigue.  While checking labs I will check a CMP lipid panel and a CBC.  I would like his LDL cholesterol to be less than 100.  Recommended adding losartan  50 mg a day to hydrochlorothiazide  to lower blood pressure as his systolic blood pressure is often in the 150s.  Patient has not been taking Cymbalta  or Lyrica  or metoprolol  or rosuvastatin 

## 2024-02-10 ENCOUNTER — Ambulatory Visit: Payer: Self-pay | Admitting: Family Medicine

## 2024-02-10 DIAGNOSIS — R102 Pelvic and perineal pain unspecified side: Secondary | ICD-10-CM | POA: Insufficient documentation

## 2024-02-10 DIAGNOSIS — M25562 Pain in left knee: Secondary | ICD-10-CM | POA: Insufficient documentation

## 2024-02-11 ENCOUNTER — Other Ambulatory Visit: Payer: Self-pay | Admitting: Family Medicine

## 2024-02-11 DIAGNOSIS — E7800 Pure hypercholesterolemia, unspecified: Secondary | ICD-10-CM

## 2024-02-12 ENCOUNTER — Other Ambulatory Visit: Payer: Self-pay | Admitting: Family Medicine

## 2024-02-12 ENCOUNTER — Ambulatory Visit: Admitting: Family Medicine

## 2024-02-12 DIAGNOSIS — M797 Fibromyalgia: Secondary | ICD-10-CM

## 2024-02-20 ENCOUNTER — Other Ambulatory Visit: Payer: Self-pay | Admitting: Family Medicine

## 2024-02-20 DIAGNOSIS — M357 Hypermobility syndrome: Secondary | ICD-10-CM

## 2024-02-20 DIAGNOSIS — M797 Fibromyalgia: Secondary | ICD-10-CM

## 2024-02-20 DIAGNOSIS — M7918 Myalgia, other site: Secondary | ICD-10-CM

## 2024-02-20 DIAGNOSIS — M170 Bilateral primary osteoarthritis of knee: Secondary | ICD-10-CM

## 2024-02-20 NOTE — Telephone Encounter (Signed)
  Requested Prescriptions  Pending Prescriptions Disp Refills   meloxicam  (MOBIC ) 15 MG tablet [Pharmacy Med Name: meloxicam  15 mg tablet] 30 tablet 2    Sig: Take 1 tablet (15 mg total) by mouth daily.     Analgesics:  COX2 Inhibitors Failed - 02/20/2024  2:43 PM      Failed - Manual Review: Labs are only required if the patient has taken medication for more than 8 weeks.      Passed - HGB in normal range and within 360 days    Hemoglobin  Date Value Ref Range Status  02/09/2024 14.1 13.2 - 17.1 g/dL Final         Passed - Cr in normal range and within 360 days    Creat  Date Value Ref Range Status  02/09/2024 0.96 0.60 - 1.29 mg/dL Final         Passed - HCT in normal range and within 360 days    HCT  Date Value Ref Range Status  02/09/2024 42.1 38.5 - 50.0 % Final         Passed - AST in normal range and within 360 days    AST  Date Value Ref Range Status  02/09/2024 13 10 - 40 U/L Final         Passed - ALT in normal range and within 360 days    ALT  Date Value Ref Range Status  02/09/2024 16 9 - 46 U/L Final         Passed - eGFR is 30 or above and within 360 days    GFR, Est African American  Date Value Ref Range Status  02/29/2020 110 > OR = 60 mL/min/1.27m2 Final   GFR, Est Non African American  Date Value Ref Range Status  02/29/2020 95 > OR = 60 mL/min/1.24m2 Final   eGFR  Date Value Ref Range Status  02/09/2024 98 > OR = 60 mL/min/1.37m2 Final         Passed - Patient is not pregnant      Passed - Valid encounter within last 12 months    Recent Outpatient Visits           1 week ago Fatigue, unspecified type   [facility] Duanne Butler DASEN, MD   6 months ago Paroxysmal atrial fibrillation [facility])   [place] [facility] Duanne Butler DASEN, MD   9 months ago Benign essential HTN   [place] [facility] Family Medicine Pickard, Butler DASEN, MD   1 year ago Elevated glucose   [place] [facility]  Family Medicine Pickard, Butler DASEN, MD   1 year ago Excessive granulation tissue   [place] [facility] Medicine Pickard, Butler DASEN, MD

## 2024-02-25 ENCOUNTER — Encounter (HOSPITAL_COMMUNITY): Payer: Self-pay | Admitting: Emergency Medicine

## 2024-02-25 ENCOUNTER — Other Ambulatory Visit: Payer: Self-pay

## 2024-02-25 ENCOUNTER — Emergency Department (HOSPITAL_COMMUNITY)
Admission: EM | Admit: 2024-02-25 | Discharge: 2024-02-25 | Disposition: A | Discharge status: Home / Self Care | Attending: Emergency Medicine | Admitting: Emergency Medicine

## 2024-02-25 DIAGNOSIS — Z7901 Long term (current) use of anticoagulants: Secondary | ICD-10-CM | POA: Insufficient documentation

## 2024-02-25 DIAGNOSIS — J45909 Unspecified asthma, uncomplicated: Secondary | ICD-10-CM | POA: Insufficient documentation

## 2024-02-25 DIAGNOSIS — G43909 Migraine, unspecified, not intractable, without status migrainosus: Secondary | ICD-10-CM | POA: Diagnosis not present

## 2024-02-25 DIAGNOSIS — Z79899 Other long term (current) drug therapy: Secondary | ICD-10-CM | POA: Diagnosis not present

## 2024-02-25 DIAGNOSIS — R519 Headache, unspecified: Secondary | ICD-10-CM

## 2024-02-25 DIAGNOSIS — R030 Elevated blood-pressure reading, without diagnosis of hypertension: Secondary | ICD-10-CM

## 2024-02-25 DIAGNOSIS — I10 Essential (primary) hypertension: Secondary | ICD-10-CM | POA: Diagnosis not present

## 2024-02-25 LAB — CBC
HCT: 44.2 % (ref 39.0–52.0)
Hemoglobin: 15.1 g/dL (ref 13.0–17.0)
MCH: 29.5 pg (ref 26.0–34.0)
MCHC: 34.2 g/dL (ref 30.0–36.0)
MCV: 86.3 fL (ref 80.0–100.0)
Platelets: 285 K/uL (ref 150–400)
RBC: 5.12 MIL/uL (ref 4.22–5.81)
RDW: 12.6 % (ref 11.5–15.5)
WBC: 8 K/uL (ref 4.0–10.5)
nRBC: 0 % (ref 0.0–0.2)

## 2024-02-25 LAB — COMPREHENSIVE METABOLIC PANEL WITH GFR
ALT: 23 U/L (ref 0–44)
AST: 20 U/L (ref 15–41)
Albumin: 4.1 g/dL (ref 3.5–5.0)
Alkaline Phosphatase: 63 U/L (ref 38–126)
Anion gap: 11 (ref 5–15)
BUN: 13 mg/dL (ref 6–20)
CO2: 22 mmol/L (ref 22–32)
Calcium: 9.7 mg/dL (ref 8.9–10.3)
Chloride: 104 mmol/L (ref 98–111)
Creatinine, Ser: 1.02 mg/dL (ref 0.61–1.24)
GFR, Estimated: >60 mL/min (ref >60)
Glucose, Bld: 103 mg/dL — ABNORMAL HIGH (ref 70–99)
Potassium: 3.5 mmol/L (ref 3.5–5.1)
Sodium: 137 mmol/L (ref 135–145)
Total Bilirubin: 0.7 mg/dL (ref 0.0–1.2)
Total Protein: 7.2 g/dL (ref 6.5–8.1)

## 2024-02-25 LAB — LIPASE, BLOOD: Lipase: 32 U/L (ref 11–51)

## 2024-02-25 MED ORDER — PROCHLORPERAZINE EDISYLATE 10 MG/2ML IJ SOLN
10.0000 mg | Freq: Once | INTRAMUSCULAR | Status: AC
  Administered 2024-02-25: 10 mg via INTRAVENOUS
  Filled 2024-02-25: qty 2

## 2024-02-25 MED ORDER — ONDANSETRON HCL 4 MG/2ML IJ SOLN
INTRAMUSCULAR | Status: AC
  Filled 2024-02-25: qty 2

## 2024-02-25 MED ORDER — SODIUM CHLORIDE 0.9 % IV BOLUS
1000.0000 mL | Freq: Once | INTRAVENOUS | Status: AC
  Administered 2024-02-25: 1000 mL via INTRAVENOUS

## 2024-02-25 MED ORDER — DIPHENHYDRAMINE HCL 50 MG/ML IJ SOLN
50.0000 mg | Freq: Once | INTRAMUSCULAR | Status: AC
  Administered 2024-02-25: 50 mg via INTRAVENOUS
  Filled 2024-02-25: qty 1

## 2024-02-25 MED ORDER — PROCHLORPERAZINE MALEATE 10 MG PO TABS: 10.0000 mg | ORAL_TABLET | Freq: Two times a day (BID) | ORAL | 0 refills | Status: DC | PRN

## 2024-02-25 MED ORDER — ONDANSETRON HCL 4 MG/2ML IJ SOLN
4.0000 mg | Freq: Once | INTRAMUSCULAR | Status: AC | PRN
  Administered 2024-02-25: 4 mg via INTRAVENOUS

## 2024-02-25 NOTE — ED Provider Notes (Signed)
 [place] EMERGENCY DEPARTMENT AT [facility] Provider Note   CSN: [id number] Arrival date & time: 02/25/24  1140     Patient presents with: Migraine and Hypertension   Stephen Allen is a 48 y.o. male.   The history is provided by the patient and medical records. No language interpreter was used.  Migraine This is a recurrent problem. The current episode started more than 2 days ago. The problem occurs constantly. The problem has not changed since onset.Associated symptoms include headaches. Pertinent negatives include no chest pain, no abdominal pain and no shortness of breath. Nothing aggravates the symptoms. Nothing relieves the symptoms. He has tried nothing for the symptoms. The treatment provided no relief.  Hypertension Associated symptoms include headaches. Pertinent negatives include no chest pain, no abdominal pain and no shortness of breath.       Prior to Admission medications   Medication Sig Start Date End Date Taking? Authorizing Provider  ALPRAZolam  (XANAX ) 0.5 MG tablet TAKE 1 TABLET BY MOUTH 3 TIMES A DAY AS NEEDED FOR ANXIETY. 01/06/23   Duanne Butler DASEN, MD  DULoxetine  (CYMBALTA ) 30 MG capsule TAKE (1) CAPSULE BY MOUTH ONCE DAILY. 02/12/24   Duanne Butler DASEN, MD  Glucos-Chondroit-Hyaluron-MSM (GLUCOSAMINE CHONDROITIN JOINT PO) Take by mouth.    [provider]  hydrochlorothiazide  (HYDRODIURIL ) 25 MG tablet TAKE 1 TABLET BY MOUTH ONCE DAILY. 11/25/23   Duanne Butler DASEN, MD  losartan  (COZAAR ) 50 MG tablet Take 1 tablet (50 mg total) by mouth daily. 02/09/24   Duanne Butler DASEN, MD  meloxicam  (MOBIC ) 15 MG tablet Take 1 tablet (15 mg total) by mouth daily. 02/20/24   Duanne Butler DASEN, MD  metoprolol  succinate (TOPROL -XL) 25 MG 24 hr tablet Take 1 tablet (25 mg total) by mouth daily. Patient not taking: Reported on 02/09/2024 08/08/23   Duanne Butler DASEN, MD  metoprolol  succinate (TOPROL -XL) 50 MG 24 hr tablet TAKE (1) TABLET BY MOUTH ONCE  DAILY. Patient taking differently: Take 50 mg by mouth daily. Pt taking as needed. 02/26/19   Strader, Laymon HERO, PA-C  pregabalin  (LYRICA ) 50 MG capsule Take 1 capsule (50 mg total) by mouth 2 (two) times daily. Patient not taking: Reported on 02/09/2024 12/30/23   Duanne Butler DASEN, MD  rivaroxaban  (XARELTO ) 20 MG TABS tablet Take 1 tablet (20 mg total) by mouth daily with supper. Patient not taking: Reported on 02/09/2024 08/22/23   Mallipeddi, Vishnu P, MD  rosuvastatin  (CRESTOR ) 10 MG tablet Take 1 tablet (10 mg total) by mouth daily. Patient not taking: Reported on 02/09/2024 05/08/23   Duanne Butler DASEN, MD  sildenafil  (VIAGRA ) 100 MG tablet Take 0.5-1 tablets (50-100 mg total) by mouth daily as needed for erectile dysfunction. 08/21/23   Duanne Butler DASEN, MD  traMADol  (ULTRAM ) 50 MG tablet TAKE ONE TABLET BY MOUTH TWICE DAILY. 10/21/23   Duanne Butler DASEN, MD    Allergies: Cortisone and Albuterol     Review of Systems  Constitutional:  Negative for chills, fatigue and fever.  HENT:  Negative for congestion.   Eyes:  Negative for pain and visual disturbance.  Respiratory:  Negative for cough, chest tightness, shortness of breath and wheezing.   Cardiovascular:  Negative for chest pain, palpitations and leg swelling.  Gastrointestinal:  Positive for nausea and vomiting. Negative for abdominal distention, abdominal pain, constipation and diarrhea.  Genitourinary:  Negative for dysuria and flank pain.  Musculoskeletal:  Negative for back pain, neck pain and neck stiffness.  Skin:  Negative for wound.  Neurological:  Positive for headaches. Negative for syncope, facial asymmetry, speech difficulty, weakness, light-headedness and numbness.  Psychiatric/Behavioral:  Negative for agitation and confusion.   All other systems reviewed and are negative.   Updated Vital Signs BP (!) 165/101   Pulse 80   Temp (!) 97.5 F (36.4 C)   Resp 19   Ht 5' 9 (1.753 m)   Wt 112 kg   SpO2 100%   BMI  36.46 kg/m   Physical Exam Vitals and nursing note reviewed.  Constitutional:      General: He is not in acute distress.    Appearance: He is well-developed. He is not ill-appearing, toxic-appearing or diaphoretic.  HENT:     Head: Normocephalic and atraumatic.     Nose: No congestion or rhinorrhea.     Mouth/Throat:     Mouth: Mucous membranes are moist.     Pharynx: No oropharyngeal exudate or posterior oropharyngeal erythema.  Eyes:     Conjunctiva/sclera: Conjunctivae normal.  Cardiovascular:     Rate and Rhythm: Normal rate and regular rhythm.     Pulses: Normal pulses.     Heart sounds: No murmur heard. Pulmonary:     Effort: Pulmonary effort is normal. No respiratory distress.     Breath sounds: Normal breath sounds. No wheezing, rhonchi or rales.  Chest:     Chest wall: No tenderness.  Abdominal:     Palpations: Abdomen is soft.     Tenderness: There is no abdominal tenderness. There is no right CVA tenderness, left CVA tenderness, guarding or rebound.  Musculoskeletal:        General: No swelling or tenderness.     Cervical back: Neck supple. No tenderness.     Right lower leg: No edema.     Left lower leg: No edema.  Skin:    General: Skin is warm and dry.     Capillary Refill: Capillary refill takes less than 2 seconds.     Findings: No erythema or rash.  Neurological:     General: No focal deficit present.     Mental Status: He is alert.     Sensory: No sensory deficit.     Motor: No weakness.  Psychiatric:        Mood and Affect: Mood normal.     (all labs ordered are listed, but only abnormal results are displayed) Labs Reviewed  COMPREHENSIVE METABOLIC PANEL WITH GFR - Abnormal; Notable for the following components:      Result Value   Glucose, Bld 103 (*)    All other components within normal limits  LIPASE, BLOOD  CBC    EKG: None  Radiology: No results found.   Procedures   Medications Ordered in the ED  ondansetron  (ZOFRAN )  injection 4 mg (4 mg Intravenous Given 02/25/24 1156)  sodium chloride  0.9 % bolus 1,000 mL (1,000 mLs Intravenous New Bag/Given 02/25/24 1336)  prochlorperazine  (COMPAZINE ) injection 10 mg (10 mg Intravenous Given 02/25/24 1337)  diphenhydrAMINE  (BENADRYL ) injection 50 mg (50 mg Intravenous Given 02/25/24 1337)                                    Medical Decision Making Amount and/or Complexity of Data Reviewed Labs: ordered.  Risk Prescription drug management.    Stephen Allen is a 48 y.o. male with a past medical history significant for paroxysmal atrial fibrillation on Xarelto , hypertension, asthma, fibromyalgia, and  migraines who presents with headache and elevated blood pressures.  According to patient, he recently had his blood pressure medication changed around and his blood pressures have been trending higher.  He reports his blood pressure felt high and on arrival was about 165.  He thinks this has been making his headaches worsen.  He reports the headaches themselves are identical to his previous migrainous headaches and denies any new features.  He reports it was not sudden or sharp and it was more gradual for the last few days with light sensitivity sound sensitivity and nausea and vomiting.  Denies any head trauma.  Denies neck pain or neck stiffness.  No chills.  Denies any constipation, diarrhea, or urinary changes.  Denies any pain in his neck down.  Denies any vision changes or speech changes.  He has not missed any medications.  He says that his pain worsens the blood pressure that worsens the headache.  Denies any neurologic complaints otherwise.  On exam, lungs clear.  Chest tender.  Abdomen nontender.  Neck nontender.  No focal neurologic deficits.  Symmetric sensation strength and pulses in extremities.  Symmetric smile.  Clear speech.  Pupil symmetric and reactive with normal extraocular movements.  No evidence of trauma.  No rashes.  Initial blood pressure 165 but on repeat it  is 137/85.  We had a shared decision-making conversation about management.  We discussed that he is on blood thinners had elevated blood pressures and is having headache it is unreasonable to consider getting a CT of the head and a large mount of screening labs however patient says that this headache is similar to prior he would rather focus on symptom management.  We will order some fluids as his mouth was dry from the vomiting, will order Compazine  and Benadryl  with a headache cocktail.  We will hold on new blood pressure medicine as the blood pressures has improved.  Will hold on CT imaging after the discussion.  Patient agrees that his symptoms have improved he is feeling better, dissipate discharge home for PCP follow-up to discuss medication titration.  Anticipate reassessment after headache cocktail.      Labs returned overall reassuring.  Headache drastically improved after medications.  Patient says he would like to go home now as he is feeling better.  Blood pressure has improved as well.  Given his reassuring workup and exam we still agree with holding on head imaging or further workup.  Patient will give prescription for Compazine  and will follow-up with his PCP and discuss outpatient blood pressure management.  He had other questions or concerns and was discharged in good condition with improved symptoms.      Final diagnoses:  Bad headache  Migraine without status migrainosus, not intractable, unspecified migraine type  Elevated blood pressure reading    ED Discharge Orders          Ordered    prochlorperazine  (COMPAZINE ) 10 MG tablet  2 times daily PRN        02/25/24 1455            Clinical Impression: 1. Bad headache   2. Migraine without status migrainosus, not intractable, unspecified migraine type   3. Elevated blood pressure reading     Disposition: Discharge  Condition: Good  I have discussed the results, Dx and Tx plan with the pt(& family if  present). He/she/they expressed understanding and agree(s) with the plan. Discharge instructions discussed at great length. Strict return precautions discussed and pt &/or family  have verbalized understanding of the instructions. No further questions at time of discharge.    Current Discharge Medication List     START taking these medications   Details  prochlorperazine  (COMPAZINE ) 10 MG tablet Take 1 tablet (10 mg total) by mouth 2 (two) times daily as needed for nausea or vomiting. Qty: 10 tablet, Refills: 0        Follow Up: Duanne Butler DASEN, MD [address] [place] [address] [phone]     [facility] Emergency Department at [facility] [address] [place]  [address] [phone]        Hasaan Radde, Lonni PARAS, MD 02/25/24 [phone]

## 2024-02-25 NOTE — ED Triage Notes (Signed)
 Pt reports HTN for 3 days that triggered a migraine. Vomited multiple times on way to hospital and in triage. Recently added on another HTN med.

## 2024-02-25 NOTE — Discharge Instructions (Signed)
 Your history, exam, and evaluation today seemed consistent with a migraine type headache that may have been triggered by your intermittently elevated blood pressures.  Her blood pressures improved after being here and your improvement in your headache with the medications.  We had a shared decision-making conversation and agreed to hold on CT imaging or more advanced workup today given the similar to prior and improvement with medications.  As your blood pressure improved we agreed to hold on the blood pressure medications I do recommend follow-up with your primary doctor to discuss blood pressure management strategies.  Please rest and stay hydrated.  If any symptoms change or worsen acutely, return to the nearest emergency department.

## 2024-02-25 NOTE — ED Triage Notes (Signed)
 Pt here from home with c/o htn and migraines along with some n/v , pt has not missed any of his meds

## 2024-02-25 NOTE — ED Notes (Signed)
 Pt reports feeling better. Still has a headache but it's better than before. Provider aware.

## 2024-03-09 ENCOUNTER — Ambulatory Visit (HOSPITAL_COMMUNITY)
Admission: RE | Admit: 2024-03-09 | Discharge: 2024-03-09 | Disposition: A | Discharge status: Home / Self Care | Payer: Self-pay | Source: Ambulatory Visit | Attending: Family Medicine | Admitting: Family Medicine

## 2024-03-09 DIAGNOSIS — E7800 Pure hypercholesterolemia, unspecified: Secondary | ICD-10-CM | POA: Insufficient documentation

## 2024-03-11 ENCOUNTER — Ambulatory Visit: Payer: Self-pay | Admitting: Family Medicine

## 2024-03-11 NOTE — Progress Notes (Signed)
 P.t notified by my chart result letter .

## 2024-03-14 ENCOUNTER — Encounter: Payer: Self-pay | Admitting: Family Medicine

## 2024-03-15 ENCOUNTER — Telehealth: Payer: Self-pay

## 2024-03-15 NOTE — Telephone Encounter (Signed)
 My chart message sent

## 2024-03-16 ENCOUNTER — Other Ambulatory Visit: Payer: Self-pay

## 2024-03-16 DIAGNOSIS — I10 Essential (primary) hypertension: Secondary | ICD-10-CM

## 2024-03-16 DIAGNOSIS — R030 Elevated blood-pressure reading, without diagnosis of hypertension: Secondary | ICD-10-CM

## 2024-03-16 MED ORDER — AMLODIPINE BESYLATE 5 MG PO TABS: 5.0000 mg | ORAL_TABLET | Freq: Every day | ORAL | 3 refills | Status: DC

## 2024-04-06 ENCOUNTER — Other Ambulatory Visit: Payer: Self-pay | Admitting: Family Medicine

## 2024-04-07 NOTE — Telephone Encounter (Signed)
 Requested medication (s) are due for refill today:   Provider to review  Requested medication (s) are on the active medication list:   Yes  Future visit scheduled:   No.   LOV 02/09/2024   Last ordered: 10/21/2023 #60, 3 refills  Non delegated refill    Requested Prescriptions  Pending Prescriptions Disp Refills   traMADol  (ULTRAM ) 50 MG tablet [Pharmacy Med Name: tramadol  50 mg tablet] 60 tablet 3    Sig: TAKE ONE TABLET BY MOUTH TWICE DAILY.     Not Delegated - Analgesics:  Opioid Agonists Failed - 04/07/2024  1:37 PM      Failed - This refill cannot be delegated      Failed - Urine Drug Screen completed in last 360 days      Failed - Valid encounter within last 3 months    Recent Outpatient Visits           1 month ago Fatigue, unspecified type   [place] [facility] Duanne Butler DASEN, MD   8 months ago Paroxysmal atrial fibrillation [facility])   [place] [facility] Duanne Butler DASEN, MD   11 months ago Benign essential HTN   [place] [facility] Pickard, Butler DASEN, MD   1 year ago Elevated glucose   [place] [facility] Duanne, Butler DASEN, MD   1 year ago Excessive granulation tissue   [place] [facility] Pickard, Butler DASEN, MD

## 2024-05-24 ENCOUNTER — Other Ambulatory Visit: Payer: Self-pay | Admitting: Family Medicine

## 2024-07-17 ENCOUNTER — Encounter: Payer: Self-pay | Admitting: Family Medicine

## 2024-07-28 ENCOUNTER — Telehealth (HOSPITAL_BASED_OUTPATIENT_CLINIC_OR_DEPARTMENT_OTHER): Payer: Self-pay | Admitting: *Deleted

## 2024-07-28 NOTE — Telephone Encounter (Signed)
 Will update all parties pt has appt 08/20/24 with Dr. Mallipeddi. I will be sure the appt notes reflect need preop clearance.

## 2024-07-28 NOTE — Telephone Encounter (Signed)
"  ° °  Pre-operative Risk Assessment    Patient Name: Stephen Allen  DOB: 10-03-1975 MRN: [id number]   Date of last office visit: 08/22/23 DR. MALLIPEDDI Date of next office visit: 08/20/24 DR. MALLIPEDDI; 1 YR F/U   Request for Surgical Clearance    Procedure:  LEFT TOTAL KNEE ARTHROPLASTY  Date of Surgery:  Clearance 09/21/24                                Surgeon:  DR. DEMPSEY MOAN Surgeon's Group or Practice Name:  [facility] Phone number:  [phone] KERRI MAZE Fax number:  [phone]   Type of Clearance Requested:   - Medical    Type of Anesthesia:  CHOICE   Additional requests/questions:    Bonney Niels Jest   07/28/2024, 12:50 PM   "

## 2024-07-28 NOTE — Telephone Encounter (Signed)
" ° °  Name: Stephen Allen  DOB: 1975/12/19  MRN: [id number]  Primary Cardiologist: Vishnu P Mallipeddi, MD  Chart reviewed as part of pre-operative protocol coverage. Because of Stephen Allen's past medical history and time since last visit, he will require a follow-up in-office visit in order to better assess preoperative cardiovascular risk.  Patient has appointment with Dr. Mallipeddi on 08/20/24  Pre-op covering staff: - Please schedule appointment and call patient to inform them. If patient already had an upcoming appointment within acceptable timeframe, please add pre-op clearance to the appointment notes so provider is aware. - Please contact requesting surgeon's office via preferred method (i.e, phone, fax) to inform them of need for appointment prior to surgery.    Rollo FABIENE Louder, PA-C  07/28/2024, 1:00 PM   "

## 2024-07-30 ENCOUNTER — Telehealth: Payer: Self-pay | Admitting: Family Medicine

## 2024-07-30 ENCOUNTER — Other Ambulatory Visit: Payer: Self-pay

## 2024-07-30 DIAGNOSIS — M797 Fibromyalgia: Secondary | ICD-10-CM

## 2024-07-30 DIAGNOSIS — M170 Bilateral primary osteoarthritis of knee: Secondary | ICD-10-CM

## 2024-07-30 DIAGNOSIS — M357 Hypermobility syndrome: Secondary | ICD-10-CM

## 2024-07-30 DIAGNOSIS — M7918 Myalgia, other site: Secondary | ICD-10-CM

## 2024-07-30 MED ORDER — MELOXICAM 15 MG PO TABS: 15.0000 mg | ORAL_TABLET | Freq: Every day | ORAL | 2 refills | Status: AC

## 2024-07-30 NOTE — Telephone Encounter (Signed)
 Sent in medication

## 2024-07-30 NOTE — Telephone Encounter (Signed)
 Prescription Request  07/30/2024  LOV: 02/09/2024  What is the name of the medication or equipment?   meloxicam  (MOBIC ) 15 MG tablet  **90 day script requested**  Have you contacted your pharmacy to request a refill? Yes   Which pharmacy would you like this sent to?  [facility] [place], [place] - [id number] [place] [address] Phone: [phone] Fax: [phone]    Patient notified that their request is being sent to the clinical staff for review and that they should receive a response within 2 business days.   Please advise pharmacist.

## 2024-08-02 ENCOUNTER — Encounter: Payer: Self-pay | Admitting: Family Medicine

## 2024-08-02 ENCOUNTER — Ambulatory Visit: Admitting: Family Medicine

## 2024-08-02 VITALS — BP 144/84 | Temp 97.6°F | Ht 69.0 in | Wt 245.2 lb

## 2024-08-02 DIAGNOSIS — M7918 Myalgia, other site: Secondary | ICD-10-CM | POA: Diagnosis not present

## 2024-08-02 DIAGNOSIS — Z0001 Encounter for general adult medical examination with abnormal findings: Secondary | ICD-10-CM

## 2024-08-02 DIAGNOSIS — I10 Essential (primary) hypertension: Secondary | ICD-10-CM

## 2024-08-02 DIAGNOSIS — G4733 Obstructive sleep apnea (adult) (pediatric): Secondary | ICD-10-CM

## 2024-08-02 DIAGNOSIS — I480 Paroxysmal atrial fibrillation: Secondary | ICD-10-CM

## 2024-08-02 DIAGNOSIS — R5382 Chronic fatigue, unspecified: Secondary | ICD-10-CM

## 2024-08-02 DIAGNOSIS — R7303 Prediabetes: Secondary | ICD-10-CM

## 2024-08-02 DIAGNOSIS — Z0000 Encounter for general adult medical examination without abnormal findings: Secondary | ICD-10-CM

## 2024-08-02 DIAGNOSIS — E7800 Pure hypercholesterolemia, unspecified: Secondary | ICD-10-CM

## 2024-08-02 MED ORDER — LOSARTAN POTASSIUM 50 MG PO TABS: 50.0000 mg | ORAL_TABLET | Freq: Every day | ORAL | 3 refills | Status: AC

## 2024-08-02 NOTE — Progress Notes (Signed)
 "  Subjective:    Patient ID: Stephen Allen, male    DOB: 11-08-1975, 49 y.o.   MRN: [id number]  Patient is a 49 year old Caucasian gentleman here today for a physical exam.  He has stopped most of his medication.  He is not taking a statin.  He is not taking Xarelto .  His CHADS2 score is 1.  Therefore his calculated risk of stroke is less than 3% a year.  He stopped taking hydrochlorothiazide .  He stopped taking amlodipine .  He is currently taking metoprolol  once a week or less to help control his heart rate only when he feels it flutter.  Otherwise he feels well.  He does have some fatigue.  He is inconsistent with controlling his sleep apnea due to his inability to wear the CPAP machine.  We discussed the inspire device for sleep apnea but at the present time he is not interested.  His colon cancer test is up-to-date.  He declines a flu shot.  His tetanus shot is up-to-date Past Medical History:  Diagnosis Date   Arthritis    [employer] 04/03/2020   Asthma    Dx'd age 89   Asthma    [employer] 04/03/2020   Migraines    Obesity    Past Surgical History:  Procedure Laterality Date   HEMANGIOMA EXCISION  04/21/2006   SHOULDER SURGERY Left 07/22/2004   TOE SURGERY Left 06/2021   [facility] (2nd toe)   Current Outpatient Medications on File Prior to Visit  Medication Sig Dispense Refill   ALPRAZolam  (XANAX ) 0.5 MG tablet TAKE 1 TABLET BY MOUTH 3 TIMES A DAY AS NEEDED FOR ANXIETY. 30 tablet 0   amLODipine  (NORVASC ) 5 MG tablet Take 1 tablet (5 mg total) by mouth daily. 30 tablet 3   DULoxetine  (CYMBALTA ) 30 MG capsule TAKE (1) CAPSULE BY MOUTH ONCE DAILY. (Patient not taking: Reported on 02/25/2024) 90 capsule 1   Glucos-Chondroit-Hyaluron-MSM (GLUCOSAMINE CHONDROITIN JOINT PO) Take 1 tablet by mouth daily.     hydrochlorothiazide  (HYDRODIURIL ) 25 MG tablet TAKE 1 TABLET BY MOUTH ONCE DAILY. 90 tablet 0   meloxicam  (MOBIC ) 15 MG tablet Take 1 tablet (15 mg total) by mouth daily. 30 tablet  2   metoprolol  succinate (TOPROL -XL) 25 MG 24 hr tablet Take 1 tablet (25 mg total) by mouth daily. (Patient taking differently: Take 25 mg by mouth daily as needed (for blood pressure).) 90 tablet 3   metoprolol  succinate (TOPROL -XL) 50 MG 24 hr tablet TAKE (1) TABLET BY MOUTH ONCE DAILY. (Patient not taking: Reported on 02/25/2024) 90 tablet 0   pregabalin  (LYRICA ) 50 MG capsule Take 1 capsule (50 mg total) by mouth 2 (two) times daily. (Patient not taking: Reported on 02/09/2024) 60 capsule 5   prochlorperazine  (COMPAZINE ) 10 MG tablet Take 1 tablet (10 mg total) by mouth 2 (two) times daily as needed for nausea or vomiting. 10 tablet 0   rivaroxaban  (XARELTO ) 20 MG TABS tablet Take 1 tablet (20 mg total) by mouth daily with supper. (Patient not taking: Reported on 02/09/2024) 30 tablet 11   rosuvastatin  (CRESTOR ) 10 MG tablet Take 1 tablet (10 mg total) by mouth daily. (Patient not taking: Reported on 02/09/2024) 90 tablet 3   sildenafil  (VIAGRA ) 100 MG tablet TAKE ONE-HALF (ONE-HALF) TO APPLY WHOLE TABLET BY MOUTH DAILY AS NEEDED FOR ERECTILE DYSFUNCTION. 5 tablet 2   traMADol  (ULTRAM ) 50 MG tablet TAKE ONE TABLET BY MOUTH TWICE DAILY. 60 tablet 3   No current facility-administered medications  on file prior to visit.   Allergies  Allergen Reactions   Cortisone Other (See Comments)    Hx of retinal detachment   Albuterol      Migraine    Social History   Socioeconomic History   Marital status: Married    Spouse name: Not on file   Number of children: Not on file   Years of education: Not on file   Highest education level: Not on file  Occupational History   Not on file  Tobacco Use   Smoking status: Never   Smokeless tobacco: Never  Vaping Use   Vaping status: Never Used  Substance and Sexual Activity   Alcohol use: Yes    Comment: Rarely   Drug use: No   Sexual activity: Yes    Partners: Female    Comment: married  Other Topics Concern   Not on file  Social History  Narrative   Not on file   Social Drivers of Health   Tobacco Use: Low Risk (02/25/2024)   Patient History    Smoking Tobacco Use: Never    Smokeless Tobacco Use: Never    Passive Exposure: Not on file  Financial Resource Strain: Not on file  Food Insecurity: Not on file  Transportation Needs: Not on file  Physical Activity: Not on file  Stress: Not on file  Social Connections: Not on file  Intimate Partner Violence: Not on file  Depression (EYV7-0): Medium Risk (02/09/2024)   Depression (PHQ2-9)    PHQ-2 Score: 8  Alcohol Screen: Not on file  Housing: Not on file  Utilities: Not on file  Health Literacy: Not on file     Review of Systems  All other systems reviewed and are negative.      Objective:   Physical Exam Vitals reviewed.  Constitutional:      General: He is not in acute distress.    Appearance: Normal appearance. He is well-developed. He is obese. He is not diaphoretic.  HENT:     Right Ear: There is impacted cerumen.     Left Ear: Tympanic membrane and ear canal normal.     Nose: Nose normal. No congestion or rhinorrhea.     Mouth/Throat:     Mouth: Mucous membranes are moist.     Pharynx: Oropharynx is clear. No oropharyngeal exudate or posterior oropharyngeal erythema.  Neck:     Vascular: No carotid bruit.  Cardiovascular:     Rate and Rhythm: Normal rate and regular rhythm.     Heart sounds: Normal heart sounds. No murmur heard.    No friction rub. No gallop.  Pulmonary:     Effort: Pulmonary effort is normal. No respiratory distress.     Breath sounds: Normal breath sounds. No stridor. No wheezing, rhonchi or rales.  Chest:     Chest wall: No tenderness.  Abdominal:     General: Abdomen is flat. Bowel sounds are normal.     Palpations: Abdomen is soft.     Hernia: No hernia is present.  Musculoskeletal:     Cervical back: Normal range of motion. No rigidity.     Right lower leg: No edema.     Left lower leg: No edema.  Lymphadenopathy:      Cervical: No cervical adenopathy.  Skin:    Coloration: Skin is not jaundiced or pale.     Findings: No bruising, erythema, lesion or rash.  Neurological:     General: No focal deficit present.     Mental  Status: He is alert and oriented to person, place, and time. Mental status is at baseline.     Cranial Nerves: No cranial nerve deficit.     Sensory: No sensory deficit.     Motor: No weakness.     Coordination: Coordination normal.     Gait: Gait normal.     Deep Tendon Reflexes: Reflexes normal.           Assessment & Plan:  Myofascial pain dysfunction syndrome  Benign essential HTN - Plan: CBC with Differential/Platelet, Comprehensive metabolic panel with GFR, Lipid panel, TSH, Testosterone  Total,Free,Bio, Males  Pure hypercholesterolemia - Plan: CBC with Differential/Platelet, Comprehensive metabolic panel with GFR, Lipid panel, TSH, Testosterone  Total,Free,Bio, Males  Paroxysmal atrial fibrillation (HCC) - Plan: CBC with Differential/Platelet, Comprehensive metabolic panel with GFR, Lipid panel, TSH, Testosterone  Total,Free,Bio, Males  Prediabetes - Plan: CBC with Differential/Platelet, Comprehensive metabolic panel with GFR, Lipid panel, TSH, Hemoglobin A1c  Obstructive sleep apnea syndrome  General medical exam  Chronic fatigue - Plan: TSH, Testosterone  Total,Free,Bio, Males Blood pressure is elevated today.  Patient does not like taking amlodipine  and hydrochlorothiazide .  Therefore we will replace them with losartan  50 mg a day.  I will check a CBC a CMP and a lipid panel.  I would like to see his LDL cholesterol less than 899.  The patient continues to complain of fatigue which I believe is most likely due to his fibromyalgia coupled with his untreated sleep apnea however I will repeat a TSH and a testosterone  level.  Patient declines a flu shot today.  We did discuss a referral to ENT for the inspire treatment for the obstructive sleep apnea.  At the present time he  declines this referral. "

## 2024-08-04 ENCOUNTER — Other Ambulatory Visit

## 2024-08-10 ENCOUNTER — Ambulatory Visit: Admitting: Internal Medicine

## 2024-08-20 ENCOUNTER — Ambulatory Visit: Admitting: Internal Medicine

## 2024-08-24 ENCOUNTER — Encounter: Payer: Self-pay | Admitting: Student

## 2024-08-24 ENCOUNTER — Ambulatory Visit: Admitting: Student

## 2024-08-24 VITALS — BP 130/80 | HR 73 | Ht 69.0 in | Wt 248.4 lb

## 2024-08-24 DIAGNOSIS — Z01810 Encounter for preprocedural cardiovascular examination: Secondary | ICD-10-CM

## 2024-08-24 DIAGNOSIS — I480 Paroxysmal atrial fibrillation: Secondary | ICD-10-CM | POA: Diagnosis not present

## 2024-08-24 DIAGNOSIS — I10 Essential (primary) hypertension: Secondary | ICD-10-CM

## 2024-08-24 NOTE — Patient Instructions (Signed)
 Medication Instructions:  Your physician recommends that you continue on your current medications as directed. Please refer to the Current Medication list given to you today.  *If you need a refill on your cardiac medications before your next appointment, please call your pharmacy*  Lab Work: NONE   If you have labs (blood work) drawn today and your tests are completely normal, you will receive your results only by: MyChart Message (if you have MyChart) OR A paper copy in the mail If you have any lab test that is abnormal or we need to change your treatment, we will call you to review the results.  Testing/Procedures: NONE   Follow-Up: At [facility], you and your health needs are our priority.  As part of our continuing mission to provide you with exceptional heart care, our providers are all part of one team.  This team includes your primary Cardiologist (physician) and Advanced Practice Providers or APPs (Physician Assistants and Nurse Practitioners) who all work together to provide you with the care you need, when you need it.  Your next appointment:    As Needed   Provider:   You may see Vishnu P Mallipeddi, MD or one of the following Advanced Practice Providers on your designated Care Team:   [place], PA-C  [place], [place] Theotis Flake, [place]     We recommend signing up for the patient portal called MyChart.  Sign up information is provided on this After Visit Summary.  MyChart is used to connect with patients for Virtual Visits (Telemedicine).  Patients are able to view lab/test results, encounter notes, upcoming appointments, etc.  Non-urgent messages can be sent to your provider as well.   To learn more about what you can do with MyChart, go to [email or web address].   Other Instructions Thank you for choosing [facility]!

## 2024-08-24 NOTE — Progress Notes (Unsigned)
 "  Cardiology Office Note    Date:  08/25/2024  ID:  Stephen Allen, DOB 08/06/1975, MRN [id number] Cardiologist: Diannah SHAUNNA Maywood, MD { : History of Present Illness:    Stephen Allen is a 49 y.o. male with past medical history of paroxysmal atrial fibrillation, HTN and asthma who presents to the office today for overdue 84-month follow-up.  He was last examined by Dr. Maywood in 07/2023 and his iWatch had shown episodes of atrial fibrillation. He did report intermittent shortness of breath and fatigue and his PCP had recently started him on Metoprolol  with improvement in symptoms. A 2-week event monitor was recommended for further assessment along with a follow-up echocardiogram. He was also started on Xarelto  for anticoagulation and a home sleep study was recommended for OSA assessment. His event monitor showed predominantly normal sinus rhythm with no recurrent atrial fibrillation. He did have rare PAC's and PVC's but less than 1% burden. Echocardiogram showed a preserved EF of 60 to 65% with no regional wall motion abnormalities and he did have trivial MR. He also underwent a calcium  score by his PCP in 02/2024 which showed a coronary artery calcium  score of 0.  Earlier this month, the office received a preoperative cardiac clearance request for left total knee arthroplasty and was recommended to address at his upcoming visit on 08/20/2024 but he canceled the appointment and it was rescheduled for today.  In talking with the patient today, he reports overall doing well from a cardiac perspective since his last visit. Reports his palpitations have improved since the time of his last visit. He is currently taking Toprol -XL 25 mg daily. Says blood pressure was previously elevated and he was started on Losartan  50 mg daily by his PCP within the past month. Was previously intolerant to Hydrochlorothiazide . He goes to the gym several days a week and lifts weights and does ride an exercise bike for warm  up. Also swims at the [facility] and does laps for 30+ minutes without any chest pain or dyspnea exertion. No specific orthopnea, PND or pitting edema. He did stop Xarelto  after discussion with his PCP given his CHA2DS2-VASc score of 1.  Studies Reviewed:   EKG: EKG is ordered today and demonstrates:   EKG Interpretation Date/Time:  Tuesday August 24 2024 14:10:28 EST Ventricular Rate:  76 PR Interval:  182 QRS Duration:  92 QT Interval:  380 QTC Calculation: 427 R Axis:   -13  Text Interpretation: Normal sinus rhythm Normal ECG When compared with ECG of 22-Aug-2023 13:28, No significant change was found Confirmed by Johnson Grate ([id number]) on 08/24/2024 2:16:28 PM       Echocardiogram: 08/2023 IMPRESSIONS     1. Left ventricular ejection fraction, by estimation, is 60 to 65%. The  left ventricle has normal function. The left ventricle has no regional  wall motion abnormalities. Left ventricular diastolic parameters were  normal.   2. Right ventricular systolic function is normal. The right ventricular  size is normal.   3. The mitral valve is normal in structure. Trivial mitral valve  regurgitation. No evidence of mitral stenosis.   4. The aortic valve is tricuspid. Aortic valve regurgitation is not  visualized. No aortic stenosis is present.   5. The inferior vena cava is normal in size with greater than 50%  respiratory variability, suggesting right atrial pressure of 3 mmHg.   Event Monitor: 09/2023   Patch wear time was for 13 days and 23 hours.   Normal sinus rhythm  ranging from 45 bpm to 158 bpm with an average HR 73 bpm.   No evidence of atrial or ventricular arrhythmias.   No evidence of high-grade AV block or pauses.   <1% PAC burden and <1% PVC burden.   Patient triggered events correlated with NSR (73 to 95 bpm).   Unremarkable event monitor  Cardiac CT Scoring: 02/2024 Coronary Calcium  Score:   Left main: 0   Left anterior descending artery: 0    Left circumflex artery: 0   Right coronary artery: 0   Total: 0   Percentile: 0   Pericardium: Normal.   Aorta: Normal caliber of ascending aorta. No aortic atherosclerosis noted.   Non-cardiac: See separate report from [facility].   IMPRESSION: Coronary calcium  score of 0. This was 0 percentile for age-, race-, and sex-matched controls.    Risk Assessment/Calculations:   CHA2DS2-VASc Score = 1  This indicates a 0.6% annual risk of stroke. The patient's score is based upon: CHF History: 0 HTN History: 1 Diabetes History: 0 Stroke History: 0 Vascular Disease History: 0 Age Score: 0 Gender Score: 0   Physical Exam:   VS:  BP 130/80 (BP Location: Right Arm, Cuff Size: Large)   Pulse 73   Ht 5' 9 (1.753 m)   Wt 248 lb 6.4 oz (112.7 kg)   SpO2 96%   BMI 36.68 kg/m    Wt Readings from Last 3 Encounters:  08/24/24 248 lb 6.4 oz (112.7 kg)  08/02/24 245 lb 3.2 oz (111.2 kg)  02/25/24 246 lb 14.6 oz (112 kg)     GEN: Pleasant male appearing in no acute distress NECK: No JVD; No carotid bruits CARDIAC: RRR, no murmurs, rubs, gallops RESPIRATORY:  Clear to auscultation without rales, wheezing or rhonchi  ABDOMEN: Appears non-distended. No obvious abdominal masses. EXTREMITIES: No clubbing or cyanosis. No pitting edema.  Distal pedal pulses are 2+ bilaterally.   Assessment and Plan:   1. PAF (paroxysmal atrial fibrillation) (HCC) - He reports his palpitations have improved since his last office visit and he is in normal sinus rhythm by examination and EKG today. Continue Toprol -XL 25 mg daily for rate-control. - He has preferred not to be on anticoagulation given his current CHA2DS2-VASc score of 1.   2. Essential hypertension - His blood pressure was previously elevated when checked at home earlier this month but has improved since being started on Losartan  and taking Toprol -XL on a daily basis. BP is at 130/80 during today's visit. Continue current  medical therapy with Losartan  50 mg daily and Toprol -XL 25 mg daily. Creatinine was stable at 1.02 when checked in 02/2024.  3. Preoperative cardiovascular examination - He is able to perform more than 4 METS of activity without any anginal symptoms and RCRI risk is overall low at 0.5% risk of a major cardiac event. He does not require further cardiac testing prior to his upcoming surgery. He is no longer on anticoagulation. He does mention that he was instructed to hold all of medications for 1 week and I recommend that he continue his blood pressure medications up until the day of surgery and confirm with the surgical center if he should take his medications that morning with a sip of water or hold the day of surgery. Will route today's note to the requesting provider.   Disposition: He prefers to follow-up with Cardiology as needed unless acute issues arise in the interim. Toprol -XL is being prescribed by his PCP.   Signed, Laymon CHRISTELLA Qua, PA-C   "

## 2024-08-25 ENCOUNTER — Encounter: Payer: Self-pay | Admitting: Student

## 2025-08-08 ENCOUNTER — Encounter: Admitting: Family Medicine
